# Patient Record
Sex: Female | Born: 1985 | Race: White | Hispanic: No | Marital: Married | Smoking: Former smoker
Health system: Southern US, Community
[De-identification: ages and names within clinical notes are randomized; demographics above are authoritative.]

## PROBLEM LIST (undated history)

## (undated) DIAGNOSIS — R51 Headache: Secondary | ICD-10-CM

## (undated) DIAGNOSIS — Z800 Family history of malignant neoplasm of digestive organs: Secondary | ICD-10-CM

## (undated) DIAGNOSIS — B009 Herpesviral infection, unspecified: Secondary | ICD-10-CM

## (undated) DIAGNOSIS — Z803 Family history of malignant neoplasm of breast: Secondary | ICD-10-CM

## (undated) DIAGNOSIS — Z8049 Family history of malignant neoplasm of other genital organs: Secondary | ICD-10-CM

## (undated) DIAGNOSIS — Z808 Family history of malignant neoplasm of other organs or systems: Secondary | ICD-10-CM

## (undated) HISTORY — DX: Family history of malignant neoplasm of other organs or systems: Z80.8

## (undated) HISTORY — PX: NO PAST SURGERIES: SHX2092

## (undated) HISTORY — DX: Herpesviral infection, unspecified: B00.9

## (undated) HISTORY — DX: Family history of malignant neoplasm of breast: Z80.3

## (undated) HISTORY — DX: Family history of malignant neoplasm of digestive organs: Z80.0

## (undated) HISTORY — DX: Family history of malignant neoplasm of other genital organs: Z80.49

## (undated) HISTORY — DX: Headache: R51

---
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
## 2007-10-22 ENCOUNTER — Emergency Department (HOSPITAL_BASED_OUTPATIENT_CLINIC_OR_DEPARTMENT_OTHER): Admission: EM | Admit: 2007-10-22 | Discharge: 2007-10-23 | Payer: Self-pay | Admitting: Emergency Medicine

## 2008-08-09 ENCOUNTER — Encounter: Payer: Self-pay | Admitting: Obstetrics and Gynecology

## 2008-08-09 ENCOUNTER — Ambulatory Visit: Payer: Self-pay | Admitting: Obstetrics & Gynecology

## 2008-08-09 LAB — CONVERTED CEMR LAB
Antibody Screen: NEGATIVE
Basophils Absolute: 0 10*3/uL (ref 0.0–0.1)
Basophils Relative: 0 % (ref 0–1)
Eosinophils Absolute: 0.2 10*3/uL (ref 0.0–0.7)
Eosinophils Relative: 2 % (ref 0–5)
HCT: 43.1 % (ref 36.0–46.0)
Hemoglobin: 14.6 g/dL (ref 12.0–15.0)
Hepatitis B Surface Ag: NEGATIVE
Lymphocytes Relative: 23 % (ref 12–46)
Lymphs Abs: 3.1 10*3/uL (ref 0.7–4.0)
MCHC: 33.9 g/dL (ref 30.0–36.0)
MCV: 91.5 fL (ref 78.0–100.0)
Monocytes Absolute: 0.9 10*3/uL (ref 0.1–1.0)
Monocytes Relative: 7 % (ref 3–12)
Neutro Abs: 9.1 10*3/uL — ABNORMAL HIGH (ref 1.7–7.7)
Neutrophils Relative %: 68 % (ref 43–77)
Platelets: 360 10*3/uL (ref 150–400)
RBC: 4.71 M/uL (ref 3.87–5.11)
RDW: 13.8 % (ref 11.5–15.5)
Rh Type: POSITIVE
Rubella: 32.1 intl units/mL — ABNORMAL HIGH
WBC: 13.4 10*3/uL — ABNORMAL HIGH (ref 4.0–10.5)
hCG, Beta Chain, Quant, S: 33813.4 milliintl units/mL

## 2008-08-10 ENCOUNTER — Inpatient Hospital Stay (HOSPITAL_COMMUNITY): Admission: RE | Admit: 2008-08-10 | Discharge: 2008-08-10 | Payer: Self-pay | Admitting: Obstetrics and Gynecology

## 2009-08-27 ENCOUNTER — Emergency Department (HOSPITAL_COMMUNITY): Admission: EM | Admit: 2009-08-27 | Discharge: 2009-08-27 | Payer: Self-pay | Admitting: Emergency Medicine

## 2010-05-11 LAB — RAPID STREP SCREEN (MED CTR MEBANE ONLY): Streptococcus, Group A Screen (Direct): POSITIVE — AB

## 2011-02-24 NOTE — L&D Delivery Note (Signed)
Delivery Note At 3:34 AM a viable female was delivered via Vaginal, Vacuum assisted Delivery (Presentation: Vertex, OA).  APGAR: 4, 7; weight .   Placenta status: Intact, Spontaneous.  Cord: 3 vessels with the following complications: None.  Cord pH: pending.  Anesthesia:  Epidural Episiotomy: None Lacerations: 4th degree;Perineal Suture Repair: 3.0 vicryl Est. Blood Loss (mL):   Mom to postpartum.  Baby to nursery-stable.  [name] 12/11/2011, 4:31 AM

## 2011-02-24 NOTE — L&D Delivery Note (Signed)
Pt. Was complete and pushing x 2.5 hours with head at + 3-+4 station and slow progress. EFW 8-8.5 lbs. LOA position.  Risks and benefits of Vacuum discussed and pt. Agreed after 10 minutes of FHR in the 90's.  Bladder emptied. Kiwi vacuum applied and pulled x 6 contractions with steady progress and no pop-offs.  After delivery of the head, nuchal cord reduced x 1, there was a moderate shoulder dystocia, relieved with delivery of the posterior shoulder after Mc-Roberts.  Infant taken to warmer. Pt. Delivered a 9 lb 15 oz VMI.  Peds arrived to help resuscitate the baby and noted a left clavicular fracture. Cord pH 7.235, cord blood obtained. Placenta delivered spontaneously and intact with 3VC.  4th degree laceration noted.  4th degree repaired with 3-0 vicryl on an SH, running in 2 layers.  Normal rectal exam following closure.  3rd degree repair with 2-0 Vicryl in 2 figure of eights.  2nd degree repaired with 2-0 vicryl running.  Bleeding noted and vagina packed with 1 inch packing in Estrace cream.  Moderate vulvar edema noted.  Ice pack applied.

## 2011-04-06 ENCOUNTER — Ambulatory Visit (INDEPENDENT_AMBULATORY_CARE_PROVIDER_SITE_OTHER): Payer: Private Health Insurance - Indemnity | Admitting: Family Medicine

## 2011-04-06 VITALS — BP 119/72 | Ht 62.0 in | Wt 142.0 lb

## 2011-04-06 DIAGNOSIS — O3680X0 Pregnancy with inconclusive fetal viability, not applicable or unspecified: Secondary | ICD-10-CM

## 2011-04-06 DIAGNOSIS — Z3480 Encounter for supervision of other normal pregnancy, unspecified trimester: Secondary | ICD-10-CM

## 2011-04-06 MED ORDER — PRENATAL RX 60-1 MG PO TABS: 1.0000 | ORAL_TABLET | Freq: Every day | ORAL | Status: DC

## 2011-04-06 NOTE — Progress Notes (Signed)
Patient is here for her new OB intake she is very nervous as she miscarried with her first pregnancy at approximately 11 weeks.  Discussed first trimester screening, her and her partner will consider and let us know at her next visit.  Blood work is drawn and bedside ultrasound shows intrauterine gestational sac measuring approximately 5 weeks 5 days.  No fetal pole is visualized, we will send patient for an official ultrasound at the hospital to confirm her dates and viability in about two weeks.  Prenatal vitamins were called in for her.

## 2011-04-07 LAB — OBSTETRIC PANEL
Antibody Screen: NEGATIVE
Basophils Absolute: 0 10*3/uL (ref 0.0–0.1)
Basophils Relative: 0 % (ref 0–1)
Eosinophils Absolute: 0.3 10*3/uL (ref 0.0–0.7)
Eosinophils Relative: 3 % (ref 0–5)
HCT: 42.1 % (ref 36.0–46.0)
Hemoglobin: 13.6 g/dL (ref 12.0–15.0)
Hepatitis B Surface Ag: NEGATIVE
Lymphocytes Relative: 20 % (ref 12–46)
Lymphs Abs: 2.4 10*3/uL (ref 0.7–4.0)
MCH: 30.9 pg (ref 26.0–34.0)
MCHC: 32.3 g/dL (ref 30.0–36.0)
MCV: 95.7 fL (ref 78.0–100.0)
Monocytes Absolute: 0.7 10*3/uL (ref 0.1–1.0)
Monocytes Relative: 6 % (ref 3–12)
Neutro Abs: 8.3 10*3/uL — ABNORMAL HIGH (ref 1.7–7.7)
Neutrophils Relative %: 71 % (ref 43–77)
Platelets: 330 10*3/uL (ref 150–400)
RBC: 4.4 MIL/uL (ref 3.87–5.11)
RDW: 13.4 % (ref 11.5–15.5)
Rh Type: POSITIVE
Rubella: 38.5 IU/mL — ABNORMAL HIGH
WBC: 11.7 10*3/uL — ABNORMAL HIGH (ref 4.0–10.5)

## 2011-04-07 LAB — HIV ANTIBODY (ROUTINE TESTING W REFLEX): HIV: NONREACTIVE

## 2011-04-20 ENCOUNTER — Ambulatory Visit (HOSPITAL_COMMUNITY)
Admission: RE | Admit: 2011-04-20 | Discharge: 2011-04-20 | Disposition: A | Discharge status: Home / Self Care | Payer: Private Health Insurance - Indemnity | Source: Ambulatory Visit | Attending: Obstetrics & Gynecology | Admitting: Obstetrics & Gynecology

## 2011-04-20 DIAGNOSIS — O3680X0 Pregnancy with inconclusive fetal viability, not applicable or unspecified: Secondary | ICD-10-CM

## 2011-04-20 DIAGNOSIS — O09299 Supervision of pregnancy with other poor reproductive or obstetric history, unspecified trimester: Secondary | ICD-10-CM | POA: Insufficient documentation

## 2011-04-20 DIAGNOSIS — Z3689 Encounter for other specified antenatal screening: Secondary | ICD-10-CM | POA: Insufficient documentation

## 2011-04-23 ENCOUNTER — Ambulatory Visit (INDEPENDENT_AMBULATORY_CARE_PROVIDER_SITE_OTHER): Payer: Private Health Insurance - Indemnity | Admitting: Obstetrics & Gynecology

## 2011-04-23 ENCOUNTER — Encounter: Payer: Self-pay | Admitting: Obstetrics & Gynecology

## 2011-04-23 ENCOUNTER — Other Ambulatory Visit: Payer: Self-pay | Admitting: Obstetrics & Gynecology

## 2011-04-23 VITALS — BP 104/68 | Wt 143.0 lb

## 2011-04-23 DIAGNOSIS — Z1272 Encounter for screening for malignant neoplasm of vagina: Secondary | ICD-10-CM

## 2011-04-23 DIAGNOSIS — Z3480 Encounter for supervision of other normal pregnancy, unspecified trimester: Secondary | ICD-10-CM

## 2011-04-23 DIAGNOSIS — Z113 Encounter for screening for infections with a predominantly sexual mode of transmission: Secondary | ICD-10-CM

## 2011-04-23 DIAGNOSIS — Z3682 Encounter for antenatal screening for nuchal translucency: Secondary | ICD-10-CM

## 2011-04-23 DIAGNOSIS — L700 Acne vulgaris: Secondary | ICD-10-CM | POA: Insufficient documentation

## 2011-04-23 DIAGNOSIS — Z3490 Encounter for supervision of normal pregnancy, unspecified, unspecified trimester: Secondary | ICD-10-CM

## 2011-04-23 DIAGNOSIS — L708 Other acne: Secondary | ICD-10-CM

## 2011-04-23 NOTE — Progress Notes (Signed)
   Subjective:    Norma Fuentes is a G2P0010 [redacted]w[redacted]d being seen today for her first obstetrical visit.  Her obstetrical history is significant for prior early miscarriage. Patient does intend to breast feed. Pregnancy history fully reviewed.  Patient reports fatigue, nausea and no bleeding.  Filed Vitals:   04/23/11 0900  BP: 104/68  Weight: 143 lb (64.864 kg)    HISTORY: OB History    Grav Para Term Preterm Abortions TAB SAB Ect Mult Living   2 0 0 0 1 0 1 0 0 0      # Outc Date GA Lbr Len/2nd Wgt Sex Del Anes PTL Lv   1 SAB 2011           2 CUR              Past Medical History  Diagnosis Date  . Headache    No past surgical history on file. Family History  Problem Relation Age of Onset  . Endometriosis Maternal Aunt   . Diabetes Paternal Grandmother      Exam    Uterine Size: size equals dates  Pelvic Exam:    Perineum: No Hemorrhoids, Normal Perineum   Vulva: normal   Vagina:  normal mucosa, normal discharge   pH: not done   Cervix: no cervical motion tenderness, no lesions and nulliparous appearance Pap done   Adnexa: normal adnexa and no mass, fullness, tenderness   Bony Pelvis: average  System: Breast:  normal appearance, no masses or tenderness, No nipple retraction or dimpling   Skin: dermatitis noted: patient has extensive cystic acne on her face, torso, back and extremities. Has been seen by dermatologists, used multiple agents   Neurologic: oriented, normal, normal mood   Extremities: normal strength, tone, and muscle mass   HEENT PERRLA and extra ocular movement intact   Mouth/Teeth mucous membranes moist, pharynx normal without lesions and dental hygiene good   Neck supple and no masses   Cardiovascular: regular rate and rhythm   Respiratory:  appears well, vitals normal, no respiratory distress, acyanotic, normal RR, neck free of mass or lymphadenopathy, chest clear, no wheezing, crepitations, rhonchi, normal symmetric air entry   Abdomen:  soft, non-tender; bowel sounds normal; no masses,  no organomegaly   Urinary: urethral meatus normal      Assessment:    Pregnancy: G2P0010 Patient Active Problem List  Diagnoses  . Supervision of normal pregnancy     Plan:     Continue prenatal vitamins. Discussed avoidance of acne-treatment agents such as retinoid, accutane that can be teratogenic; patient has not used these agents for several months Problem list reviewed and updated. Genetic Screening discussed First Screen: ordered. Ultrasound discussed; fetal survey: ordered. Bleeding and pain precautions reviewed.  Follow up in 4 weeks.  Jaynie Collins A M.D. 04/23/2011

## 2011-04-23 NOTE — Patient Instructions (Signed)
Pregnancy - First Trimester During sexual intercourse, millions of sperm go into the vagina. Only 1 sperm will penetrate and fertilize the female egg while it is in the Fallopian tube. One week later, the fertilized egg implants into the wall of the uterus. An embryo begins to develop into a baby. At 6 to 8 weeks, the eyes and face are formed and the heartbeat can be seen on ultrasound. At the end of 12 weeks (first trimester), all the baby's organs are formed. Now that you are pregnant, you will want to do everything you can to have a healthy baby. Two of the most important things are to get good prenatal care and follow your caregiver's instructions. Prenatal care is all the medical care you receive before the baby's birth. It is given to prevent, find, and treat problems during the pregnancy and childbirth. PRENATAL EXAMS  During prenatal visits, your weight, blood pressure and urine are checked. This is done to make sure you are healthy and progressing normally during the pregnancy.   A pregnant woman should gain 25 to 35 pounds during the pregnancy. However, if you are over weight or underweight, your caregiver will advise you regarding your weight.   Your caregiver will ask and answer questions for you.   Blood work, cervical cultures, other necessary tests and a Pap test are done during your prenatal exams. These tests are done to check on your health and the probable health of your baby. Tests are strongly recommended and done for HIV with your permission. This is the virus that causes AIDS. These tests are done because medications can be given to help prevent your baby from being born with this infection should you have been infected without knowing it. Blood work is also used to find out your blood type, previous infections and follow your blood levels (hemoglobin).   Low hemoglobin (anemia) is common during pregnancy. Iron and vitamins are given to help prevent this. Later in the pregnancy,  blood tests for diabetes will be done along with any other tests if any problems develop. You may need tests to make sure you and the baby are doing well.   You may need other tests to make sure you and the baby are doing well.  CHANGES DURING THE FIRST TRIMESTER (THE FIRST 3 MONTHS OF PREGNANCY) Your body goes through many changes during pregnancy. They vary from person to person. Talk to your caregiver about changes you notice and are concerned about. Changes can include:  Your menstrual period stops.   The egg and sperm carry the genes that determine what you look like. Genes from you and your partner are forming a baby. The female genes determine whether the baby is a boy or a girl.   Your body increases in girth and you may feel bloated.   Feeling sick to your stomach (nauseous) and throwing up (vomiting). If the vomiting is uncontrollable, call your caregiver.   Your breasts will begin to enlarge and become tender.   Your nipples may stick out more and become darker.   The need to urinate more. Painful urination may mean you have a bladder infection.   Tiring easily.   Loss of appetite.   Cravings for certain kinds of food.   At first, you may gain or lose a couple of pounds.   You may have changes in your emotions from day to day (excited to be pregnant or concerned something may go wrong with the pregnancy and baby).     You may have more vivid and strange dreams.  HOME CARE INSTRUCTIONS   It is very important to avoid all smoking, alcohol and un-prescribed drugs during your pregnancy. These affect the formation and growth of the baby. Avoid chemicals while pregnant to ensure the delivery of a healthy infant.   Start your prenatal visits by the 12th week of pregnancy. They are usually scheduled monthly at first, then more often in the last 2 months before delivery. Keep your caregiver's appointments. Follow your caregiver's instructions regarding medication use, blood and lab  tests, exercise, and diet.   During pregnancy, you are providing food for you and your baby. Eat regular, well-balanced meals. Choose foods such as meat, fish, milk and other low fat dairy products, vegetables, fruits, and whole-grain breads and cereals. Your caregiver will tell you of the ideal weight gain.   You can help morning sickness by keeping soda crackers at the bedside. Eat a couple before arising in the morning. You may want to use the crackers without salt on them.   Eating 4 to 5 small meals rather than 3 large meals a day also may help the nausea and vomiting.   Drinking liquids between meals instead of during meals also seems to help nausea and vomiting.   A physical sexual relationship may be continued throughout pregnancy if there are no other problems. Problems may be early (premature) leaking of amniotic fluid from the membranes, vaginal bleeding, or belly (abdominal) pain.   Exercise regularly if there are no restrictions. Check with your caregiver or physical therapist if you are unsure of the safety of some of your exercises. Greater weight gain will occur in the last 2 trimesters of pregnancy. Exercising will help:   Control your weight.   Keep you in shape.   Prepare you for labor and delivery.   Help you lose your pregnancy weight after you deliver your baby.   Wear a good support or jogging bra for breast tenderness during pregnancy. This may help if worn during sleep too.   Ask when prenatal classes are available. Begin classes when they are offered.   Do not use hot tubs, steam rooms or saunas.   Wear your seat belt when driving. This protects you and your baby if you are in an accident.   Avoid raw meat, uncooked cheese, cat litter boxes and soil used by cats throughout the pregnancy. These carry germs that can cause birth defects in the baby.   The first trimester is a good time to visit your dentist for your dental health. Getting your teeth cleaned is  OK. Use a softer toothbrush and brush gently during pregnancy.   Ask for help if you have financial, counseling or nutritional needs during pregnancy. Your caregiver will be able to offer counseling for these needs as well as refer you for other special needs.   Do not take any medications or herbs unless told by your caregiver.   Inform your caregiver if there is any mental or physical domestic violence.   Make a list of emergency phone numbers of family, friends, hospital, and police and fire departments.   Write down your questions. Take them to your prenatal visit.   Do not douche.   Do not cross your legs.   If you have to stand for long periods of time, rotate you feet or take small steps in a circle.   You may have more vaginal secretions that may require a sanitary pad. Do not use   tampons or scented sanitary pads.  MEDICATIONS AND DRUG USE IN PREGNANCY  Take prenatal vitamins as directed. The vitamin should contain 1 milligram of folic acid. Keep all vitamins out of reach of children. Only a couple vitamins or tablets containing iron may be fatal to a baby or young child when ingested.   Avoid use of all medications, including herbs, over-the-counter medications, not prescribed or suggested by your caregiver. Only take over-the-counter or prescription medicines for pain, discomfort, or fever as directed by your caregiver. Do not use aspirin, ibuprofen, or naproxen unless directed by your caregiver.   Let your caregiver also know about herbs you may be using.   Alcohol is related to a number of birth defects. This includes fetal alcohol syndrome. All alcohol, in any form, should be avoided completely. Smoking will cause low birth rate and premature babies.   Street or illegal drugs are very harmful to the baby. They are absolutely forbidden. A baby born to an addicted mother will be addicted at birth. The baby will go through the same withdrawal an adult does.   Let your  caregiver know about any medications that you have to take and for what reason you take them.  MISCARRIAGE IS COMMON DURING PREGNANCY A miscarriage does not mean you did something wrong. It is not a reason to worry about getting pregnant again. Your caregiver will help you with questions you may have. If you have a miscarriage, you may need minor surgery. SEEK MEDICAL CARE IF:  You have any concerns or worries during your pregnancy. It is better to call with your questions if you feel they cannot wait, rather than worry about them. SEEK IMMEDIATE MEDICAL CARE IF:   An unexplained oral temperature above 102 F (38.9 C) develops, or as your caregiver suggests.   You have leaking of fluid from the vagina (birth canal). If leaking membranes are suspected, take your temperature and inform your caregiver of this when you call.   There is vaginal spotting or bleeding. Notify your caregiver of the amount and how many pads are used.   You develop a bad smelling vaginal discharge with a change in the color.   You continue to feel sick to your stomach (nauseated) and have no relief from remedies suggested. You vomit blood or coffee ground-like materials.   You lose more than 2 pounds of weight in 1 week.   You gain more than 2 pounds of weight in 1 week and you notice swelling of your face, hands, feet, or legs.   You gain 5 pounds or more in 1 week (even if you do not have swelling of your hands, face, legs, or feet).   You get exposed to German measles and have never had them.   You are exposed to fifth disease or chickenpox.   You develop belly (abdominal) pain. Round ligament discomfort is a common non-cancerous (benign) cause of abdominal pain in pregnancy. Your caregiver still must evaluate this.   You develop headache, fever, diarrhea, pain with urination, or shortness of breath.   You fall or are in a car accident or have any kind of trauma.   There is mental or physical violence in  your home.  Document Released: 02/03/2001 Document Revised: 10/22/2010 Document Reviewed: 08/07/2008 [employer] Patient Information 2012 [employer]. 

## 2011-05-21 ENCOUNTER — Ambulatory Visit (INDEPENDENT_AMBULATORY_CARE_PROVIDER_SITE_OTHER): Payer: Private Health Insurance - Indemnity | Admitting: Obstetrics & Gynecology

## 2011-05-21 VITALS — BP 113/70 | Wt 148.0 lb

## 2011-05-21 DIAGNOSIS — Z3400 Encounter for supervision of normal first pregnancy, unspecified trimester: Secondary | ICD-10-CM

## 2011-05-21 DIAGNOSIS — Z3490 Encounter for supervision of normal pregnancy, unspecified, unspecified trimester: Secondary | ICD-10-CM

## 2011-05-21 DIAGNOSIS — Z3480 Encounter for supervision of other normal pregnancy, unspecified trimester: Secondary | ICD-10-CM

## 2011-05-21 NOTE — Progress Notes (Signed)
Routine visit. No problems. She has First Trimester screen scheduled.

## 2011-06-02 ENCOUNTER — Encounter: Payer: Self-pay | Admitting: Obstetrics & Gynecology

## 2011-06-02 ENCOUNTER — Ambulatory Visit (HOSPITAL_COMMUNITY)
Admission: RE | Admit: 2011-06-02 | Discharge: 2011-06-02 | Disposition: A | Discharge status: Home / Self Care | Payer: Private Health Insurance - Indemnity | Source: Ambulatory Visit | Attending: Obstetrics & Gynecology | Admitting: Obstetrics & Gynecology

## 2011-06-02 DIAGNOSIS — Z3689 Encounter for other specified antenatal screening: Secondary | ICD-10-CM | POA: Insufficient documentation

## 2011-06-02 DIAGNOSIS — O09299 Supervision of pregnancy with other poor reproductive or obstetric history, unspecified trimester: Secondary | ICD-10-CM | POA: Insufficient documentation

## 2011-06-02 DIAGNOSIS — Z3682 Encounter for antenatal screening for nuchal translucency: Secondary | ICD-10-CM

## 2011-06-02 DIAGNOSIS — O351XX0 Maternal care for (suspected) chromosomal abnormality in fetus, not applicable or unspecified: Secondary | ICD-10-CM | POA: Insufficient documentation

## 2011-06-02 DIAGNOSIS — O3510X0 Maternal care for (suspected) chromosomal abnormality in fetus, unspecified, not applicable or unspecified: Secondary | ICD-10-CM | POA: Insufficient documentation

## 2011-06-17 ENCOUNTER — Ambulatory Visit (INDEPENDENT_AMBULATORY_CARE_PROVIDER_SITE_OTHER): Payer: Private Health Insurance - Indemnity | Admitting: Obstetrics and Gynecology

## 2011-06-17 VITALS — BP 84/60 | Wt 152.0 lb

## 2011-06-17 DIAGNOSIS — Z3480 Encounter for supervision of other normal pregnancy, unspecified trimester: Secondary | ICD-10-CM

## 2011-06-17 DIAGNOSIS — L708 Other acne: Secondary | ICD-10-CM

## 2011-06-17 DIAGNOSIS — L700 Acne vulgaris: Secondary | ICD-10-CM

## 2011-06-17 DIAGNOSIS — Z3490 Encounter for supervision of normal pregnancy, unspecified, unspecified trimester: Secondary | ICD-10-CM

## 2011-06-17 NOTE — Progress Notes (Signed)
Patient doing well without any complaints. She declined Second trimester screening. Will schedule anatomy ultrasound

## 2011-07-06 ENCOUNTER — Ambulatory Visit (HOSPITAL_COMMUNITY)
Admission: RE | Admit: 2011-07-06 | Discharge: 2011-07-06 | Disposition: A | Discharge status: Home / Self Care | Payer: Private Health Insurance - Indemnity | Source: Ambulatory Visit | Attending: Obstetrics and Gynecology | Admitting: Obstetrics and Gynecology

## 2011-07-06 DIAGNOSIS — Z1389 Encounter for screening for other disorder: Secondary | ICD-10-CM | POA: Insufficient documentation

## 2011-07-06 DIAGNOSIS — Z3490 Encounter for supervision of normal pregnancy, unspecified, unspecified trimester: Secondary | ICD-10-CM

## 2011-07-06 DIAGNOSIS — O358XX0 Maternal care for other (suspected) fetal abnormality and damage, not applicable or unspecified: Secondary | ICD-10-CM | POA: Insufficient documentation

## 2011-07-06 DIAGNOSIS — Z363 Encounter for antenatal screening for malformations: Secondary | ICD-10-CM | POA: Insufficient documentation

## 2011-07-15 ENCOUNTER — Ambulatory Visit (INDEPENDENT_AMBULATORY_CARE_PROVIDER_SITE_OTHER): Payer: Private Health Insurance - Indemnity | Admitting: Obstetrics & Gynecology

## 2011-07-15 VITALS — BP 96/60 | Wt 162.0 lb

## 2011-07-15 DIAGNOSIS — Z3490 Encounter for supervision of normal pregnancy, unspecified, unspecified trimester: Secondary | ICD-10-CM

## 2011-07-15 DIAGNOSIS — Z3480 Encounter for supervision of other normal pregnancy, unspecified trimester: Secondary | ICD-10-CM

## 2011-07-15 DIAGNOSIS — L700 Acne vulgaris: Secondary | ICD-10-CM

## 2011-07-15 DIAGNOSIS — L708 Other acne: Secondary | ICD-10-CM

## 2011-07-15 NOTE — Progress Notes (Signed)
Normal anatomy scan.  No other complaints or concerns.  Fetal movement and labor precautions reviewed.

## 2011-07-15 NOTE — Patient Instructions (Signed)
Breastfeeding BENEFITS OF BREASTFEEDING For the baby  The first milk (colostrum) helps the baby's digestive system function better.   There are antibodies from the mother in the milk that help the baby fight off infections.   The baby has a lower incidence of asthma, allergies, and SIDS (sudden infant death syndrome).   The nutrients in breast milk are better than formulas for the baby and helps the baby's brain grow better.   Babies who breastfeed have less gas, colic, and constipation.  For the mother  Breastfeeding helps develop a very special bond between mother and baby.   It is more convenient, always available at the correct temperature and cheaper than formula feeding.   It burns calories in the mother and helps with losing weight that was gained during pregnancy.   It makes the uterus contract back down to normal size faster and slows bleeding following delivery.   Breastfeeding mothers have a lower risk of developing breast cancer.  NURSE FREQUENTLY  A healthy, full-term baby may breastfeed as often as every hour or space his or her feedings to every 3 hours.   How often to nurse will vary from baby to baby. Watch your baby for signs of hunger, not the clock.   Nurse as often as the baby requests, or when you feel the need to reduce the fullness of your breasts.   Awaken the baby if it has been 3 to 4 hours since the last feeding.   Frequent feeding will help the mother make more milk and will prevent problems like sore nipples and engorgement of the breasts.  BABY'S POSITION AT THE BREAST  Whether lying down or sitting, be sure that the baby's tummy is facing your tummy.   Support the breast with 4 fingers underneath the breast and the thumb above. Make sure your fingers are well away from the nipple and baby's mouth.   Stroke the baby's lips and cheek closest to the breast gently with your finger or nipple.   When the baby's mouth is open wide enough, place all  of your nipple and as much of the dark area around the nipple as possible into your baby's mouth.   Pull the baby in close so the tip of the nose and the baby's cheeks touch the breast during the feeding.  FEEDINGS  The length of each feeding varies from baby to baby and from feeding to feeding.   The baby must suck about 2 to 3 minutes for your milk to get to him or her. This is called a "let down." For this reason, allow the baby to feed on each breast as long as he or she wants. Your baby will end the feeding when he or she has received the right balance of nutrients.   To break the suction, put your finger into the corner of the baby's mouth and slide it between his or her gums before removing your breast from his or her mouth. This will help prevent sore nipples.  REDUCING BREAST ENGORGEMENT  In the first week after your baby is born, you may experience signs of breast engorgement. When breasts are engorged, they feel heavy, warm, full, and may be tender to the touch. You can reduce engorgement if you:   Nurse frequently, every 2 to 3 hours. Mothers who breastfeed early and often have fewer problems with engorgement.   Place light ice packs on your breasts between feedings. This reduces swelling. Wrap the ice packs in a   lightweight towel to protect your skin.   Apply moist hot packs to your breast for 5 to 10 minutes before each feeding. This increases circulation and helps the milk flow.   Gently massage your breast before and during the feeding.   Make sure that the baby empties at least one breast at every feeding before switching sides.   Use a breast pump to empty the breasts if your baby is sleepy or not nursing well. You may also want to pump if you are returning to work or or you feel you are getting engorged.   Avoid bottle feeds, pacifiers or supplemental feedings of water or juice in place of breastfeeding.   Be sure the baby is latched on and positioned properly while  breastfeeding.   Prevent fatigue, stress, and anemia.   Wear a supportive bra, avoiding underwire styles.   Eat a balanced diet with enough fluids.  If you follow these suggestions, your engorgement should improve in 24 to 48 hours. If you are still experiencing difficulty, call your lactation consultant or caregiver. IS MY BABY GETTING ENOUGH MILK? Sometimes, mothers worry about whether their babies are getting enough milk. You can be assured that your baby is getting enough milk if:  The baby is actively sucking and you hear swallowing.   The baby nurses at least 8 to 12 times in a 24 hour time period. Nurse your baby until he or she unlatches or falls asleep at the first breast (at least 10 to 20 minutes), then offer the second side.   The baby is wetting 5 to 6 disposable diapers (6 to 8 cloth diapers) in a 24 hour period by 5 to 6 days of age.   The baby is having at least 2 to 3 stools every 24 hours for the first few months. Breast milk is all the food your baby needs. It is not necessary for your baby to have water or formula. In fact, to help your breasts make more milk, it is best not to give your baby supplemental feedings during the early weeks.   The stool should be soft and yellow.   The baby should gain 4 to 7 ounces per week after he is 4 days old.  TAKE CARE OF YOURSELF Take care of your breasts by:  Bathing or showering daily.   Avoiding the use of soaps on your nipples.   Start feedings on your left breast at one feeding and on your right breast at the next feeding.   You will notice an increase in your milk supply 2 to 5 days after delivery. You may feel some discomfort from engorgement, which makes your breasts very firm and often tender. Engorgement "peaks" out within 24 to 48 hours. In the meantime, apply warm moist towels to your breasts for 5 to 10 minutes before feeding. Gentle massage and expression of some milk before feeding will soften your breasts, making  it easier for your baby to latch on. Wear a well fitting nursing bra and air dry your nipples for 10 to 15 minutes after each feeding.   Only use cotton bra pads.   Only use pure lanolin on your nipples after nursing. You do not need to wash it off before nursing.  Take care of yourself by:   Eating well-balanced meals and nutritious snacks.   Drinking milk, fruit juice, and water to satisfy your thirst (about 8 glasses a day).   Getting plenty of rest.   Increasing calcium in   your diet (1200 mg a day).   Avoiding foods that you notice affect the baby in a bad way.  SEEK MEDICAL CARE IF:   You have any questions or difficulty with breastfeeding.   You need help.   You have a hard, red, sore area on your breast, accompanied by a fever of 100.5 F (38.1 C) or more.   Your baby is too sleepy to eat well or is having trouble sleeping.   Your baby is wetting less than 6 diapers per day, by 5 days of age.   Your baby's skin or white part of his or her eyes is more yellow than it was in the hospital.   You feel depressed.  Document Released: 02/09/2005 Document Revised: 01/29/2011 Document Reviewed: 09/24/2008 [employer] Patient Information 2012 [employer]. 

## 2011-08-12 ENCOUNTER — Ambulatory Visit (INDEPENDENT_AMBULATORY_CARE_PROVIDER_SITE_OTHER): Payer: Private Health Insurance - Indemnity | Admitting: Obstetrics & Gynecology

## 2011-08-12 VITALS — BP 98/67 | Wt 169.0 lb

## 2011-08-12 DIAGNOSIS — Z3480 Encounter for supervision of other normal pregnancy, unspecified trimester: Secondary | ICD-10-CM

## 2011-08-12 DIAGNOSIS — L708 Other acne: Secondary | ICD-10-CM

## 2011-08-12 DIAGNOSIS — L700 Acne vulgaris: Secondary | ICD-10-CM

## 2011-08-12 DIAGNOSIS — Z3490 Encounter for supervision of normal pregnancy, unspecified, unspecified trimester: Secondary | ICD-10-CM

## 2011-08-12 NOTE — Progress Notes (Signed)
No complaints or concerns.  Fetal movement and labor precautions reviewed. Third trimester labs next visit.

## 2011-08-12 NOTE — Patient Instructions (Signed)
Return to clinic for any obstetric concerns or go to [facility] for evaluation  

## 2011-09-03 ENCOUNTER — Ambulatory Visit (INDEPENDENT_AMBULATORY_CARE_PROVIDER_SITE_OTHER): Payer: Private Health Insurance - Indemnity | Admitting: Obstetrics & Gynecology

## 2011-09-03 VITALS — BP 103/49 | Wt 173.0 lb

## 2011-09-03 DIAGNOSIS — Z23 Encounter for immunization: Secondary | ICD-10-CM

## 2011-09-03 DIAGNOSIS — Z3480 Encounter for supervision of other normal pregnancy, unspecified trimester: Secondary | ICD-10-CM

## 2011-09-03 DIAGNOSIS — Z3490 Encounter for supervision of normal pregnancy, unspecified, unspecified trimester: Secondary | ICD-10-CM

## 2011-09-03 LAB — CBC
HCT: 34.2 % — ABNORMAL LOW (ref 36.0–46.0)
Hemoglobin: 11.4 g/dL — ABNORMAL LOW (ref 12.0–15.0)
MCH: 30.3 pg (ref 26.0–34.0)
MCHC: 33.3 g/dL (ref 30.0–36.0)
MCV: 91 fL (ref 78.0–100.0)
Platelets: 319 10*3/uL (ref 150–400)
RBC: 3.76 MIL/uL — ABNORMAL LOW (ref 3.87–5.11)
RDW: 14 % (ref 11.5–15.5)
WBC: 14.2 10*3/uL — ABNORMAL HIGH (ref 4.0–10.5)

## 2011-09-03 MED ORDER — TETANUS-DIPHTH-ACELL PERTUSSIS 5-2.5-18.5 LF-MCG/0.5 IM SUSP
0.5000 mL | Freq: Once | INTRAMUSCULAR | Status: AC
  Administered 2011-09-03: 0.5 mL via INTRAMUSCULAR

## 2011-09-03 NOTE — Progress Notes (Signed)
1 hr GTT, third trimester labs today. Will also receive TDap vaccine today. No other complaints or concerns.  Fetal movement and labor precautions reviewed.

## 2011-09-03 NOTE — Patient Instructions (Addendum)
Return to clinic for any obstetric concerns or go to [facility] for evaluation  Pregnancy - Third Trimester The third trimester of pregnancy (the last 3 months) is a period of the most rapid growth for you and your baby. The baby approaches a length of 20 inches and a weight of 6 to 10 pounds. The baby is adding on fat and getting ready for life outside your body. While inside, babies have periods of sleeping and waking, suck their thumbs, and hiccups. You can often feel small contractions of the uterus. This is false labor. It is also called Braxton-Hicks contractions. This is like a practice for labor. The usual problems in this stage of pregnancy include more difficulty breathing, swelling of the hands and feet from water retention, and having to urinate more often because of the uterus and baby pressing on your bladder.  PRENATAL EXAMS  Blood work may continue to be done during prenatal exams. These tests are done to check on your health and the probable health of your baby. Blood work is used to follow your blood levels (hemoglobin). Anemia (low hemoglobin) is common during pregnancy. Iron and vitamins are given to help prevent this. You may also continue to be checked for diabetes. Some of the past blood tests may be done again.   The size of the uterus is measured during each visit. This makes sure your baby is growing properly according to your pregnancy dates.   Your blood pressure is checked every prenatal visit. This is to make sure you are not getting toxemia.   Your urine is checked every prenatal visit for infection, diabetes and protein.   Your weight is checked at each visit. This is done to make sure gains are happening at the suggested rate and that you and your baby are growing normally.   Sometimes, an ultrasound is performed to confirm the position and the proper growth and development of the baby. This is a test done that bounces harmless sound waves off the baby so your caregiver can  more accurately determine due dates.   Discuss the type of pain medication and anesthesia you will have during your labor and delivery.   Discuss the possibility and anesthesia if a Cesarean Section might be necessary.   Inform your caregiver if there is any mental or physical violence at home.  Sometimes, a specialized non-stress test, contraction stress test and biophysical profile are done to make sure the baby is not having a problem. Checking the amniotic fluid surrounding the baby is called an amniocentesis. The amniotic fluid is removed by sticking a needle into the belly (abdomen). This is sometimes done near the end of pregnancy if an early delivery is required. In this case, it is done to help make sure the baby's lungs are mature enough for the baby to live outside of the womb. If the lungs are not mature and it is unsafe to deliver the baby, an injection of cortisone medication is given to the mother 1 to 2 days before the delivery. This helps the baby's lungs mature and makes it safer to deliver the baby. CHANGES OCCURING IN THE THIRD TRIMESTER OF PREGNANCY Your body goes through many changes during pregnancy. They vary from person to person. Talk to your caregiver about changes you notice and are concerned about.  During the last trimester, you have probably had an increase in your appetite. It is normal to have cravings for certain foods. This varies from person to person and pregnancy  to pregnancy.   You may begin to get stretch marks on your hips, abdomen, and breasts. These are normal changes in the body during pregnancy. There are no exercises or medications to take which prevent this change.   Constipation may be treated with a stool softener or adding bulk to your diet. Drinking lots of fluids, fiber in vegetables, fruits, and whole grains are helpful.   Exercising is also helpful. If you have been very active up until your pregnancy, most of these activities can be continued  during your pregnancy. If you have been less active, it is helpful to start an exercise program such as walking. Consult your caregiver before starting exercise programs.   Avoid all smoking, alcohol, un-prescribed drugs, herbs and "street drugs" during your pregnancy. These chemicals affect the formation and growth of the baby. Avoid chemicals throughout the pregnancy to ensure the delivery of a healthy infant.   Backache, varicose veins and hemorrhoids may develop or get worse.   You will tire more easily in the third trimester, which is normal.   The baby's movements may be stronger and more often.   You may become short of breath easily.   Your belly button may stick out.   A yellow discharge may leak from your breasts called colostrum.   You may have a bloody mucus discharge. This usually occurs a few days to a week before labor begins.  HOME CARE INSTRUCTIONS   Keep your caregiver's appointments. Follow your caregiver's instructions regarding medication use, exercise, and diet.   During pregnancy, you are providing food for you and your baby. Continue to eat regular, well-balanced meals. Choose foods such as meat, fish, milk and other low fat dairy products, vegetables, fruits, and whole-grain breads and cereals. Your caregiver will tell you of the ideal weight gain.   A physical sexual relationship may be continued throughout pregnancy if there are no other problems such as early (premature) leaking of amniotic fluid from the membranes, vaginal bleeding, or belly (abdominal) pain.   Exercise regularly if there are no restrictions. Check with your caregiver if you are unsure of the safety of your exercises. Greater weight gain will occur in the last 2 trimesters of pregnancy. Exercising helps:   Control your weight.   Get you in shape for labor and delivery.   You lose weight after you deliver.   Rest a lot with legs elevated, or as needed for leg cramps or low back pain.    Wear a good support or jogging bra for breast tenderness during pregnancy. This may help if worn during sleep. Pads or tissues may be used in the bra if you are leaking colostrum.   Do not use hot tubs, steam rooms, or saunas.   Wear your seat belt when driving. This protects you and your baby if you are in an accident.   Avoid raw meat, cat litter boxes and soil used by cats. These carry germs that can cause birth defects in the baby.   It is easier to loose urine during pregnancy. Tightening up and strengthening the pelvic muscles will help with this problem. You can practice stopping your urination while you are going to the bathroom. These are the same muscles you need to strengthen. It is also the muscles you would use if you were trying to stop from passing gas. You can practice tightening these muscles up 10 times a set and repeating this about 3 times per day. Once you know what muscles  to tighten up, do not perform these exercises during urination. It is more likely to cause an infection by backing up the urine.   Ask for help if you have financial, counseling or nutritional needs during pregnancy. Your caregiver will be able to offer counseling for these needs as well as refer you for other special needs.   Make a list of emergency phone numbers and have them available.   Plan on getting help from family or friends when you go home from the hospital.   Make a trial run to the hospital.   Take prenatal classes with the father to understand, practice and ask questions about the labor and delivery.   Prepare the baby's room/nursery.   Do not travel out of the city unless it is absolutely necessary and with the advice of your caregiver.   Wear only low or no heal shoes to have better balance and prevent falling.  MEDICATIONS AND DRUG USE IN PREGNANCY  Take prenatal vitamins as directed. The vitamin should contain 1 milligram of folic acid. Keep all vitamins out of reach of  children. Only a couple vitamins or tablets containing iron may be fatal to a baby or young child when ingested.   Avoid use of all medications, including herbs, over-the-counter medications, not prescribed or suggested by your caregiver. Only take over-the-counter or prescription medicines for pain, discomfort, or fever as directed by your caregiver. Do not use aspirin, ibuprofen (Motrin, Advil, Nuprin) or naproxen (Aleve) unless OK'd by your caregiver.   Let your caregiver also know about herbs you may be using.   Alcohol is related to a number of birth defects. This includes fetal alcohol syndrome. All alcohol, in any form, should be avoided completely. Smoking will cause low birth rate and premature babies.   Street/illegal drugs are very harmful to the baby. They are absolutely forbidden. A baby born to an addicted mother will be addicted at birth. The baby will go through the same withdrawal an adult does.  SEEK MEDICAL CARE IF: You have any concerns or worries during your pregnancy. It is better to call with your questions if you feel they cannot wait, rather than worry about them. DECISIONS ABOUT CIRCUMCISION You may or may not know the sex of your baby. If you know your baby is a boy, it may be time to think about circumcision. Circumcision is the removal of the foreskin of the penis. This is the skin that covers the sensitive end of the penis. There is no proven medical need for this. Often this decision is made on what is popular at the time or based upon religious beliefs and social issues. You can discuss these issues with your caregiver or pediatrician. SEEK IMMEDIATE MEDICAL CARE IF:   An unexplained oral temperature above 102 F (38.9 C) develops, or as your caregiver suggests.   You have leaking of fluid from the vagina (birth canal). If leaking membranes are suspected, take your temperature and tell your caregiver of this when you call.   There is vaginal spotting, bleeding  or passing clots. Tell your caregiver of the amount and how many pads are used.   You develop a bad smelling vaginal discharge with a change in the color from clear to white.   You develop vomiting that lasts more than 24 hours.   You develop chills or fever.   You develop shortness of breath.   You develop burning on urination.   You loose more than 2 pounds of  weight or gain more than 2 pounds of weight or as suggested by your caregiver.   You notice sudden swelling of your face, hands, and feet or legs.   You develop belly (abdominal) pain. Round ligament discomfort is a common non-cancerous (benign) cause of abdominal pain in pregnancy. Your caregiver still must evaluate you.   You develop a severe headache that does not go away.   You develop visual problems, blurred or double vision.   If you have not felt your baby move for more than 1 hour. If you think the baby is not moving as much as usual, eat something with sugar in it and lie down on your left side for an hour. The baby should move at least 4 to 5 times per hour. Call right away if your baby moves less than that.   You fall, are in a car accident or any kind of trauma.   There is mental or physical violence at home.  Document Released: 02/03/2001 Document Revised: 01/29/2011 Document Reviewed: 08/08/2008 [facility] Patient Information 2012 [place], [place].

## 2011-09-04 LAB — GLUCOSE TOLERANCE, 1 HOUR (50G) W/O FASTING: Glucose, 1 Hour GTT: 136 mg/dL (ref 70–140)

## 2011-09-04 LAB — HIV ANTIBODY (ROUTINE TESTING W REFLEX): HIV: NONREACTIVE

## 2011-09-04 LAB — RPR

## 2011-09-07 NOTE — Progress Notes (Signed)
Appointment was given for 3hr gtt on 09/09/11 at 8am

## 2011-09-09 ENCOUNTER — Other Ambulatory Visit (INDEPENDENT_AMBULATORY_CARE_PROVIDER_SITE_OTHER): Payer: Self-pay | Admitting: *Deleted

## 2011-09-09 DIAGNOSIS — R7309 Other abnormal glucose: Secondary | ICD-10-CM

## 2011-09-10 ENCOUNTER — Encounter: Payer: Self-pay | Admitting: Obstetrics & Gynecology

## 2011-09-10 LAB — GLUCOSE TOLERANCE, 3 HOURS
Glucose Tolerance, 1 hour: 122 mg/dL (ref 70–189)
Glucose Tolerance, 2 hour: 100 mg/dL (ref 70–164)
Glucose Tolerance, Fasting: 72 mg/dL (ref 70–104)
Glucose, GTT - 3 Hour: 116 mg/dL (ref 70–144)

## 2011-09-17 ENCOUNTER — Ambulatory Visit (INDEPENDENT_AMBULATORY_CARE_PROVIDER_SITE_OTHER): Payer: Private Health Insurance - Indemnity | Admitting: Obstetrics & Gynecology

## 2011-09-17 ENCOUNTER — Encounter: Payer: Self-pay | Admitting: Obstetrics & Gynecology

## 2011-09-17 DIAGNOSIS — Z3480 Encounter for supervision of other normal pregnancy, unspecified trimester: Secondary | ICD-10-CM

## 2011-09-17 NOTE — Progress Notes (Signed)
Pt with no complaints.  +FM; no vag bleeding.  No ctx.  NO LOF. Abnl 1 hr GCT.  Nl 3hr GTT Rec f/u in 2 weeks or sooner PRN Plans to breast feed.  Info given on childbirth classes  Leily Capek L. Harraway-Smith, M.D., [facility]

## 2011-09-17 NOTE — Patient Instructions (Signed)
Pregnancy - Third Trimester The third trimester of pregnancy (the last 3 months) is a period of the most rapid growth for you and your baby. The baby approaches a length of 20 inches and a weight of 6 to 10 pounds. The baby is adding on fat and getting ready for life outside your body. While inside, babies have periods of sleeping and waking, suck their thumbs, and hiccups. You can often feel small contractions of the uterus. This is false labor. It is also called Braxton-Hicks contractions. This is like a practice for labor. The usual problems in this stage of pregnancy include more difficulty breathing, swelling of the hands and feet from water retention, and having to urinate more often because of the uterus and baby pressing on your bladder.  PRENATAL EXAMS  Blood work may continue to be done during prenatal exams. These tests are done to check on your health and the probable health of your baby. Blood work is used to follow your blood levels (hemoglobin). Anemia (low hemoglobin) is common during pregnancy. Iron and vitamins are given to help prevent this. You may also continue to be checked for diabetes. Some of the past blood tests may be done again.   The size of the uterus is measured during each visit. This makes sure your baby is growing properly according to your pregnancy dates.   Your blood pressure is checked every prenatal visit. This is to make sure you are not getting toxemia.   Your urine is checked every prenatal visit for infection, diabetes and protein.   Your weight is checked at each visit. This is done to make sure gains are happening at the suggested rate and that you and your baby are growing normally.   Sometimes, an ultrasound is performed to confirm the position and the proper growth and development of the baby. This is a test done that bounces harmless sound waves off the baby so your caregiver can more accurately determine due dates.   Discuss the type of pain  medication and anesthesia you will have during your labor and delivery.   Discuss the possibility and anesthesia if a Cesarean Section might be necessary.   Inform your caregiver if there is any mental or physical violence at home.  Sometimes, a specialized non-stress test, contraction stress test and biophysical profile are done to make sure the baby is not having a problem. Checking the amniotic fluid surrounding the baby is called an amniocentesis. The amniotic fluid is removed by sticking a needle into the belly (abdomen). This is sometimes done near the end of pregnancy if an early delivery is required. In this case, it is done to help make sure the baby's lungs are mature enough for the baby to live outside of the womb. If the lungs are not mature and it is unsafe to deliver the baby, an injection of cortisone medication is given to the mother 1 to 2 days before the delivery. This helps the baby's lungs mature and makes it safer to deliver the baby. CHANGES OCCURING IN THE THIRD TRIMESTER OF PREGNANCY Your body goes through many changes during pregnancy. They vary from person to person. Talk to your caregiver about changes you notice and are concerned about.  During the last trimester, you have probably had an increase in your appetite. It is normal to have cravings for certain foods. This varies from person to person and pregnancy to pregnancy.   You may begin to get stretch marks on your hips,   abdomen, and breasts. These are normal changes in the body during pregnancy. There are no exercises or medications to take which prevent this change.   Constipation may be treated with a stool softener or adding bulk to your diet. Drinking lots of fluids, fiber in vegetables, fruits, and whole grains are helpful.   Exercising is also helpful. If you have been very active up until your pregnancy, most of these activities can be continued during your pregnancy. If you have been less active, it is helpful  to start an exercise program such as walking. Consult your caregiver before starting exercise programs.   Avoid all smoking, alcohol, un-prescribed drugs, herbs and "street drugs" during your pregnancy. These chemicals affect the formation and growth of the baby. Avoid chemicals throughout the pregnancy to ensure the delivery of a healthy infant.   Backache, varicose veins and hemorrhoids may develop or get worse.   You will tire more easily in the third trimester, which is normal.   The baby's movements may be stronger and more often.   You may become short of breath easily.   Your belly button may stick out.   A yellow discharge may leak from your breasts called colostrum.   You may have a bloody mucus discharge. This usually occurs a few days to a week before labor begins.  HOME CARE INSTRUCTIONS   Keep your caregiver's appointments. Follow your caregiver's instructions regarding medication use, exercise, and diet.   During pregnancy, you are providing food for you and your baby. Continue to eat regular, well-balanced meals. Choose foods such as meat, fish, milk and other low fat dairy products, vegetables, fruits, and whole-grain breads and cereals. Your caregiver will tell you of the ideal weight gain.   A physical sexual relationship may be continued throughout pregnancy if there are no other problems such as early (premature) leaking of amniotic fluid from the membranes, vaginal bleeding, or belly (abdominal) pain.   Exercise regularly if there are no restrictions. Check with your caregiver if you are unsure of the safety of your exercises. Greater weight gain will occur in the last 2 trimesters of pregnancy. Exercising helps:   Control your weight.   Get you in shape for labor and delivery.   You lose weight after you deliver.   Rest a lot with legs elevated, or as needed for leg cramps or low back pain.   Wear a good support or jogging bra for breast tenderness during  pregnancy. This may help if worn during sleep. Pads or tissues may be used in the bra if you are leaking colostrum.   Do not use hot tubs, steam rooms, or saunas.   Wear your seat belt when driving. This protects you and your baby if you are in an accident.   Avoid raw meat, cat litter boxes and soil used by cats. These carry germs that can cause birth defects in the baby.   It is easier to loose urine during pregnancy. Tightening up and strengthening the pelvic muscles will help with this problem. You can practice stopping your urination while you are going to the bathroom. These are the same muscles you need to strengthen. It is also the muscles you would use if you were trying to stop from passing gas. You can practice tightening these muscles up 10 times a set and repeating this about 3 times per day. Once you know what muscles to tighten up, do not perform these exercises during urination. It is more likely   to cause an infection by backing up the urine.   Ask for help if you have financial, counseling or nutritional needs during pregnancy. Your caregiver will be able to offer counseling for these needs as well as refer you for other special needs.   Make a list of emergency phone numbers and have them available.   Plan on getting help from family or friends when you go home from the hospital.   Make a trial run to the hospital.   Take prenatal classes with the father to understand, practice and ask questions about the labor and delivery.   Prepare the baby's room/nursery.   Do not travel out of the city unless it is absolutely necessary and with the advice of your caregiver.   Wear only low or no heal shoes to have better balance and prevent falling.  MEDICATIONS AND DRUG USE IN PREGNANCY  Take prenatal vitamins as directed. The vitamin should contain 1 milligram of folic acid. Keep all vitamins out of reach of children. Only a couple vitamins or tablets containing iron may be fatal  to a baby or young child when ingested.   Avoid use of all medications, including herbs, over-the-counter medications, not prescribed or suggested by your caregiver. Only take over-the-counter or prescription medicines for pain, discomfort, or fever as directed by your caregiver. Do not use aspirin, ibuprofen (Motrin, Advil, Nuprin) or naproxen (Aleve) unless OK'd by your caregiver.   Let your caregiver also know about herbs you may be using.   Alcohol is related to a number of birth defects. This includes fetal alcohol syndrome. All alcohol, in any form, should be avoided completely. Smoking will cause low birth rate and premature babies.   Street/illegal drugs are very harmful to the baby. They are absolutely forbidden. A baby born to an addicted mother will be addicted at birth. The baby will go through the same withdrawal an adult does.  SEEK MEDICAL CARE IF: You have any concerns or worries during your pregnancy. It is better to call with your questions if you feel they cannot wait, rather than worry about them. DECISIONS ABOUT CIRCUMCISION You may or may not know the sex of your baby. If you know your baby is a boy, it may be time to think about circumcision. Circumcision is the removal of the foreskin of the penis. This is the skin that covers the sensitive end of the penis. There is no proven medical need for this. Often this decision is made on what is popular at the time or based upon religious beliefs and social issues. You can discuss these issues with your caregiver or pediatrician. SEEK IMMEDIATE MEDICAL CARE IF:   An unexplained oral temperature above 102 F (38.9 C) develops, or as your caregiver suggests.   You have leaking of fluid from the vagina (birth canal). If leaking membranes are suspected, take your temperature and tell your caregiver of this when you call.   There is vaginal spotting, bleeding or passing clots. Tell your caregiver of the amount and how many pads are  used.   You develop a bad smelling vaginal discharge with a change in the color from clear to white.   You develop vomiting that lasts more than 24 hours.   You develop chills or fever.   You develop shortness of breath.   You develop burning on urination.   You loose more than 2 pounds of weight or gain more than 2 pounds of weight or as suggested by your   caregiver.   You notice sudden swelling of your face, hands, and feet or legs.   You develop belly (abdominal) pain. Round ligament discomfort is a common non-cancerous (benign) cause of abdominal pain in pregnancy. Your caregiver still must evaluate you.   You develop a severe headache that does not go away.   You develop visual problems, blurred or double vision.   If you have not felt your baby move for more than 1 hour. If you think the baby is not moving as much as usual, eat something with sugar in it and lie down on your left side for an hour. The baby should move at least 4 to 5 times per hour. Call right away if your baby moves less than that.   You fall, are in a car accident or any kind of trauma.   There is mental or physical violence at home.  Document Released: 02/03/2001 Document Revised: 01/29/2011 Document Reviewed: 08/08/2008 [employer] Patient Information 2012 [employer]. 

## 2011-09-17 NOTE — Progress Notes (Signed)
Routine prenatal check, doing well. 

## 2011-10-01 ENCOUNTER — Encounter: Payer: Self-pay | Admitting: Obstetrics & Gynecology

## 2011-10-01 ENCOUNTER — Ambulatory Visit (INDEPENDENT_AMBULATORY_CARE_PROVIDER_SITE_OTHER): Payer: Private Health Insurance - Indemnity | Admitting: Obstetrics & Gynecology

## 2011-10-01 ENCOUNTER — Encounter: Payer: Self-pay | Admitting: Gynecology

## 2011-10-01 VITALS — BP 94/66 | Wt 177.0 lb

## 2011-10-01 DIAGNOSIS — Z3480 Encounter for supervision of other normal pregnancy, unspecified trimester: Secondary | ICD-10-CM

## 2011-10-01 DIAGNOSIS — Z8619 Personal history of other infectious and parasitic diseases: Secondary | ICD-10-CM | POA: Insufficient documentation

## 2011-10-01 DIAGNOSIS — Z3490 Encounter for supervision of normal pregnancy, unspecified, unspecified trimester: Secondary | ICD-10-CM

## 2011-10-01 NOTE — Progress Notes (Signed)
Routine visit. Good FM. No problems. 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 We have discussed starting Valtrex now versus 35 weeks. She prefers to wait until then.

## 2011-10-15 ENCOUNTER — Ambulatory Visit (INDEPENDENT_AMBULATORY_CARE_PROVIDER_SITE_OTHER): Payer: Private Health Insurance - Indemnity | Admitting: Obstetrics & Gynecology

## 2011-10-15 VITALS — BP 100/66 | Wt 180.0 lb

## 2011-10-15 DIAGNOSIS — Z3480 Encounter for supervision of other normal pregnancy, unspecified trimester: Secondary | ICD-10-CM

## 2011-10-15 DIAGNOSIS — Z8619 Personal history of other infectious and parasitic diseases: Secondary | ICD-10-CM

## 2011-10-15 DIAGNOSIS — Z3490 Encounter for supervision of normal pregnancy, unspecified, unspecified trimester: Secondary | ICD-10-CM

## 2011-10-15 MED ORDER — VALACYCLOVIR HCL 500 MG PO TABS: 500.0000 mg | ORAL_TABLET | Freq: Two times a day (BID) | ORAL | Status: DC

## 2011-10-15 NOTE — Patient Instructions (Signed)
Return to clinic for any obstetric concerns or go to [facility] for evaluation  

## 2011-10-15 NOTE — Progress Notes (Signed)
No complaints or concerns.  Valtrex prescribed, will start next week. Fetal movement and labor precautions reviewed.

## 2011-10-30 ENCOUNTER — Ambulatory Visit (INDEPENDENT_AMBULATORY_CARE_PROVIDER_SITE_OTHER): Payer: Private Health Insurance - Indemnity | Admitting: Obstetrics and Gynecology

## 2011-10-30 VITALS — BP 104/67 | Wt 180.0 lb

## 2011-10-30 DIAGNOSIS — Z3480 Encounter for supervision of other normal pregnancy, unspecified trimester: Secondary | ICD-10-CM

## 2011-10-30 DIAGNOSIS — Z8619 Personal history of other infectious and parasitic diseases: Secondary | ICD-10-CM

## 2011-10-30 DIAGNOSIS — Z3490 Encounter for supervision of normal pregnancy, unspecified, unspecified trimester: Secondary | ICD-10-CM

## 2011-10-30 DIAGNOSIS — L700 Acne vulgaris: Secondary | ICD-10-CM

## 2011-10-30 DIAGNOSIS — L708 Other acne: Secondary | ICD-10-CM

## 2011-10-30 NOTE — Progress Notes (Signed)
Patient doing well without complaints. FM/PTL precautions reviewed. Patient taking valtrex for prophylaxis.

## 2011-11-06 ENCOUNTER — Ambulatory Visit (INDEPENDENT_AMBULATORY_CARE_PROVIDER_SITE_OTHER): Payer: Private Health Insurance - Indemnity | Admitting: Family Medicine

## 2011-11-06 ENCOUNTER — Encounter: Payer: Self-pay | Admitting: Family Medicine

## 2011-11-06 VITALS — BP 105/56 | Wt 185.0 lb

## 2011-11-06 DIAGNOSIS — Z3400 Encounter for supervision of normal first pregnancy, unspecified trimester: Secondary | ICD-10-CM

## 2011-11-06 LAB — OB RESULTS CONSOLE GBS: GBS: POSITIVE

## 2011-11-06 NOTE — Patient Instructions (Addendum)
Breastfeeding BENEFITS OF BREASTFEEDING For the baby  The first milk (colostrum) helps the baby's digestive system function better.   There are antibodies from the mother in the milk that help the baby fight off infections.   The baby has a lower incidence of asthma, allergies, and SIDS (sudden infant death syndrome).   The nutrients in breast milk are better than formulas for the baby and helps the baby's brain grow better.   Babies who breastfeed have less gas, colic, and constipation.  For the mother  Breastfeeding helps develop a very special bond between mother and baby.   It is more convenient, always available at the correct temperature and cheaper than formula feeding.   It burns calories in the mother and helps with losing weight that was gained during pregnancy.   It makes the uterus contract back down to normal size faster and slows bleeding following delivery.   Breastfeeding mothers have a lower risk of developing breast cancer.  NURSE FREQUENTLY  A healthy, full-term baby may breastfeed as often as every hour or space his or her feedings to every 3 hours.   How often to nurse will vary from baby to baby. Watch your baby for signs of hunger, not the clock.   Nurse as often as the baby requests, or when you feel the need to reduce the fullness of your breasts.   Awaken the baby if it has been 3 to 4 hours since the last feeding.   Frequent feeding will help the mother make more milk and will prevent problems like sore nipples and engorgement of the breasts.  BABY'S POSITION AT THE BREAST  Whether lying down or sitting, be sure that the baby's tummy is facing your tummy.   Support the breast with 4 fingers underneath the breast and the thumb above. Make sure your fingers are well away from the nipple and baby's mouth.   Stroke the baby's lips and cheek closest to the breast gently with your finger or nipple.   When the baby's mouth is open wide enough, place  all of your nipple and as much of the dark area around the nipple as possible into your baby's mouth.   Pull the baby in close so the tip of the nose and the baby's cheeks touch the breast during the feeding.  FEEDINGS  The length of each feeding varies from baby to baby and from feeding to feeding.   The baby must suck about 2 to 3 minutes for your milk to get to him or her. This is called a "let down." For this reason, allow the baby to feed on each breast as long as he or she wants. Your baby will end the feeding when he or she has received the right balance of nutrients.   To break the suction, put your finger into the corner of the baby's mouth and slide it between his or her gums before removing your breast from his or her mouth. This will help prevent sore nipples.  REDUCING BREAST ENGORGEMENT  In the first week after your baby is born, you may experience signs of breast engorgement. When breasts are engorged, they feel heavy, warm, full, and may be tender to the touch. You can reduce engorgement if you:   Nurse frequently, every 2 to 3 hours. Mothers who breastfeed early and often have fewer problems with engorgement.   Place light ice packs on your breasts between feedings. This reduces swelling. Wrap the ice packs in a   lightweight towel to protect your skin.   Apply moist hot packs to your breast for 5 to 10 minutes before each feeding. This increases circulation and helps the milk flow.   Gently massage your breast before and during the feeding.   Make sure that the baby empties at least one breast at every feeding before switching sides.   Use a breast pump to empty the breasts if your baby is sleepy or not nursing well. You may also want to pump if you are returning to work or or you feel you are getting engorged.   Avoid bottle feeds, pacifiers or supplemental feedings of water or juice in place of breastfeeding.   Be sure the baby is latched on and positioned properly while  breastfeeding.   Prevent fatigue, stress, and anemia.   Wear a supportive bra, avoiding underwire styles.   Eat a balanced diet with enough fluids.  If you follow these suggestions, your engorgement should improve in 24 to 48 hours. If you are still experiencing difficulty, call your lactation consultant or caregiver. IS MY BABY GETTING ENOUGH MILK? Sometimes, mothers worry about whether their babies are getting enough milk. You can be assured that your baby is getting enough milk if:  The baby is actively sucking and you hear swallowing.   The baby nurses at least 8 to 12 times in a 24 hour time period. Nurse your baby until he or she unlatches or falls asleep at the first breast (at least 10 to 20 minutes), then offer the second side.   The baby is wetting 5 to 6 disposable diapers (6 to 8 cloth diapers) in a 24 hour period by 5 to 6 days of age.   The baby is having at least 2 to 3 stools every 24 hours for the first few months. Breast milk is all the food your baby needs. It is not necessary for your baby to have water or formula. In fact, to help your breasts make more milk, it is best not to give your baby supplemental feedings during the early weeks.   The stool should be soft and yellow.   The baby should gain 4 to 7 ounces per week after he is 4 days old.  TAKE CARE OF YOURSELF Take care of your breasts by:  Bathing or showering daily.   Avoiding the use of soaps on your nipples.   Start feedings on your left breast at one feeding and on your right breast at the next feeding.   You will notice an increase in your milk supply 2 to 5 days after delivery. You may feel some discomfort from engorgement, which makes your breasts very firm and often tender. Engorgement "peaks" out within 24 to 48 hours. In the meantime, apply warm moist towels to your breasts for 5 to 10 minutes before feeding. Gentle massage and expression of some milk before feeding will soften your breasts, making  it easier for your baby to latch on. Wear a well fitting nursing bra and air dry your nipples for 10 to 15 minutes after each feeding.   Only use cotton bra pads.   Only use pure lanolin on your nipples after nursing. You do not need to wash it off before nursing.  Take care of yourself by:   Eating well-balanced meals and nutritious snacks.   Drinking milk, fruit juice, and water to satisfy your thirst (about 8 glasses a day).   Getting plenty of rest.   Increasing calcium in   your diet (1200 mg a day).   Avoiding foods that you notice affect the baby in a bad way.  SEEK MEDICAL CARE IF:   You have any questions or difficulty with breastfeeding.   You need help.   You have a hard, red, sore area on your breast, accompanied by a fever of 100.5 F (38.1 C) or more.   Your baby is too sleepy to eat well or is having trouble sleeping.   Your baby is wetting less than 6 diapers per day, by 5 days of age.   Your baby's skin or white part of his or her eyes is more yellow than it was in the hospital.   You feel depressed.  Document Released: 02/09/2005 Document Revised: 01/29/2011 Document Reviewed: 09/24/2008 [employer] Patient Information 2012 [employer]. Normal Labor and Delivery Your caregiver must first be sure you are in labor. Signs of labor include:  You may pass what is called "the mucus plug" before labor begins. This is a small amount of blood stained mucus.   Regular uterine contractions.   The time between contractions get closer together.   The discomfort and pain gradually gets more intense.   Pains are mostly located in the back.   Pains get worse when walking.   The cervix (the opening of the uterus becomes thinner (begins to efface) and opens up (dilates).  Once you are in labor and admitted into the hospital or care center, your caregiver will do the following:  A complete physical examination.   Check your vital signs (blood pressure, pulse,  temperature and the fetal heart rate).   Do a vaginal examination (using a sterile glove and lubricant) to determine:   The position (presentation) of the baby (head [vertex] or buttock first).   The level (station) of the baby's head in the birth canal.   The effacement and dilatation of the cervix.   You may have your pubic hair shaved and be given an enema depending on your caregiver and the circumstance.   An electronic monitor is usually placed on your abdomen. The monitor follows the length and intensity of the contractions, as well as the baby's heart rate.   Usually, your caregiver will insert an IV in your arm with a bottle of sugar water. This is done as a precaution so that medications can be given to you quickly during labor or delivery.  NORMAL LABOR AND DELIVERY IS DIVIDED UP INTO 3 STAGES: First Stage This is when regular contractions begin and the cervix begins to efface and dilate. This stage can last from 3 to 15 hours. The end of the first stage is when the cervix is 100% effaced and 10 centimeters dilated. Pain medications may be given by   Injection (morphine, demerol, etc.)   Regional anesthesia (spinal, caudal or epidural, anesthetics given in different locations of the spine). Paracervical pain medication may be given, which is an injection of and anesthetic on each side of the cervix.  A pregnant woman may request to have "Natural Childbirth" which is not to have any medications or anesthesia during her labor and delivery. Second Stage This is when the baby comes down through the birth canal (vagina) and is born. This can take 1 to 4 hours. As the baby's head comes down through the birth canal, you may feel like you are going to have a bowel movement. You will get the urge to bear down and push until the baby is delivered. As the baby's   head is being delivered, the caregiver will decide if an episiotomy (a cut in the perineum and vagina area) is needed to prevent  tearing of the tissue in this area. The episiotomy is sewn up after the delivery of the baby and placenta. Sometimes a mask with nitrous oxide is given for the mother to breath during the delivery of the baby to help if there is too much pain. The end of Stage 2 is when the baby is fully delivered. Then when the umbilical cord stops pulsating it is clamped and cut. Third Stage The third stage begins after the baby is completely delivered and ends after the placenta (afterbirth) is delivered. This usually takes 5 to 30 minutes. After the placenta is delivered, a medication is given either by intravenous or injection to help contract the uterus and prevent bleeding. The third stage is not painful and pain medication is usually not necessary. If an episiotomy was done, it is repaired at this time. After the delivery, the mother is watched and monitored closely for 1 to 2 hours to make sure there is no postpartum bleeding (hemorrhage). If there is a lot of bleeding, medication is given to contract the uterus and stop the bleeding. Document Released: 11/19/2007 Document Revised: 01/29/2011 Document Reviewed: 11/19/2007 [employer] Patient Information 2012 [employer].  

## 2011-11-06 NOTE — Progress Notes (Signed)
Cultures today Doing well 

## 2011-11-07 LAB — GC/CHLAMYDIA PROBE AMP, GENITAL
Chlamydia, DNA Probe: NEGATIVE
GC Probe Amp, Genital: NEGATIVE

## 2011-11-11 ENCOUNTER — Encounter: Payer: Private Health Insurance - Indemnity | Admitting: Family Medicine

## 2011-11-11 ENCOUNTER — Encounter: Payer: Self-pay | Admitting: Family Medicine

## 2011-11-11 DIAGNOSIS — O99820 Streptococcus B carrier state complicating pregnancy: Secondary | ICD-10-CM | POA: Insufficient documentation

## 2011-11-11 LAB — CULTURE, BETA STREP (GROUP B ONLY)

## 2011-11-13 ENCOUNTER — Ambulatory Visit (INDEPENDENT_AMBULATORY_CARE_PROVIDER_SITE_OTHER): Payer: Private Health Insurance - Indemnity | Admitting: Obstetrics & Gynecology

## 2011-11-13 VITALS — BP 96/70 | Wt 186.0 lb

## 2011-11-13 DIAGNOSIS — Z3490 Encounter for supervision of normal pregnancy, unspecified, unspecified trimester: Secondary | ICD-10-CM

## 2011-11-13 DIAGNOSIS — Z3480 Encounter for supervision of other normal pregnancy, unspecified trimester: Secondary | ICD-10-CM

## 2011-11-13 NOTE — Patient Instructions (Signed)
Normal Labor and Delivery Your caregiver must first be sure you are in labor. Signs of labor include:  You may pass what is called "the mucus plug" before labor begins. This is a small amount of blood stained mucus.   Regular uterine contractions.   The time between contractions get closer together.   The discomfort and pain gradually gets more intense.   Pains are mostly located in the back.   Pains get worse when walking.   The cervix (the opening of the uterus becomes thinner (begins to efface) and opens up (dilates).  Once you are in labor and admitted into the hospital or care center, your caregiver will do the following:  A complete physical examination.   Check your vital signs (blood pressure, pulse, temperature and the fetal heart rate).   Do a vaginal examination (using a sterile glove and lubricant) to determine:   The position (presentation) of the baby (head [vertex] or buttock first).   The level (station) of the baby's head in the birth canal.   The effacement and dilatation of the cervix.   You may have your pubic hair shaved and be given an enema depending on your caregiver and the circumstance.   An electronic monitor is usually placed on your abdomen. The monitor follows the length and intensity of the contractions, as well as the baby's heart rate.   Usually, your caregiver will insert an IV in your arm with a bottle of sugar water. This is done as a precaution so that medications can be given to you quickly during labor or delivery.  NORMAL LABOR AND DELIVERY IS DIVIDED UP INTO 3 STAGES: First Stage This is when regular contractions begin and the cervix begins to efface and dilate. This stage can last from 3 to 15 hours. The end of the first stage is when the cervix is 100% effaced and 10 centimeters dilated. Pain medications may be given by   Injection (morphine, demerol, etc.)   Regional anesthesia (spinal, caudal or epidural, anesthetics given in  different locations of the spine). Paracervical pain medication may be given, which is an injection of and anesthetic on each side of the cervix.  A pregnant woman may request to have "Natural Childbirth" which is not to have any medications or anesthesia during her labor and delivery. Second Stage This is when the baby comes down through the birth canal (vagina) and is born. This can take 1 to 4 hours. As the baby's head comes down through the birth canal, you may feel like you are going to have a bowel movement. You will get the urge to bear down and push until the baby is delivered. As the baby's head is being delivered, the caregiver will decide if an episiotomy (a cut in the perineum and vagina area) is needed to prevent tearing of the tissue in this area. The episiotomy is sewn up after the delivery of the baby and placenta. Sometimes a mask with nitrous oxide is given for the mother to breath during the delivery of the baby to help if there is too much pain. The end of Stage 2 is when the baby is fully delivered. Then when the umbilical cord stops pulsating it is clamped and cut. Third Stage The third stage begins after the baby is completely delivered and ends after the placenta (afterbirth) is delivered. This usually takes 5 to 30 minutes. After the placenta is delivered, a medication is given either by intravenous or injection to help contract   the uterus and prevent bleeding. The third stage is not painful and pain medication is usually not necessary. If an episiotomy was done, it is repaired at this time. After the delivery, the mother is watched and monitored closely for 1 to 2 hours to make sure there is no postpartum bleeding (hemorrhage). If there is a lot of bleeding, medication is given to contract the uterus and stop the bleeding. Document Released: 11/19/2007 Document Revised: 01/29/2011 Document Reviewed: 11/19/2007 [employer] Patient Information 2012 [employer]. 

## 2011-11-13 NOTE — Progress Notes (Signed)
Pelvic pressure and occasional contractions. No blood or LOF. No sx of herpes. GBS positive

## 2011-11-19 ENCOUNTER — Ambulatory Visit (INDEPENDENT_AMBULATORY_CARE_PROVIDER_SITE_OTHER): Payer: Private Health Insurance - Indemnity | Admitting: Obstetrics & Gynecology

## 2011-11-19 ENCOUNTER — Encounter: Payer: Self-pay | Admitting: Obstetrics & Gynecology

## 2011-11-19 VITALS — BP 104/77 | Wt 188.0 lb

## 2011-11-19 DIAGNOSIS — Z3490 Encounter for supervision of normal pregnancy, unspecified, unspecified trimester: Secondary | ICD-10-CM

## 2011-11-19 DIAGNOSIS — Z8619 Personal history of other infectious and parasitic diseases: Secondary | ICD-10-CM

## 2011-11-19 DIAGNOSIS — Z3480 Encounter for supervision of other normal pregnancy, unspecified trimester: Secondary | ICD-10-CM

## 2011-11-19 NOTE — Progress Notes (Signed)
Routine visit. 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 She has only had one outbreak 5 years ago. She is taking Valtrex 500 mg BID. On exam I don't see any lesions, but I told her to take 1 gram BID and that if she feels like she has an outbreak at time of labor, then I would recommend a PLTCS. She understands. Good FM, Labor precautions reviews.

## 2011-11-26 ENCOUNTER — Ambulatory Visit (INDEPENDENT_AMBULATORY_CARE_PROVIDER_SITE_OTHER): Payer: Private Health Insurance - Indemnity | Admitting: Obstetrics & Gynecology

## 2011-11-26 VITALS — BP 100/72 | Wt 189.0 lb

## 2011-11-26 DIAGNOSIS — Z3480 Encounter for supervision of other normal pregnancy, unspecified trimester: Secondary | ICD-10-CM

## 2011-11-26 DIAGNOSIS — O99820 Streptococcus B carrier state complicating pregnancy: Secondary | ICD-10-CM

## 2011-11-26 DIAGNOSIS — Z8619 Personal history of other infectious and parasitic diseases: Secondary | ICD-10-CM

## 2011-11-26 DIAGNOSIS — Z3490 Encounter for supervision of normal pregnancy, unspecified, unspecified trimester: Secondary | ICD-10-CM

## 2011-11-26 DIAGNOSIS — O09899 Supervision of other high risk pregnancies, unspecified trimester: Secondary | ICD-10-CM

## 2011-11-26 DIAGNOSIS — Z22330 Carrier of Group B streptococcus: Secondary | ICD-10-CM

## 2011-11-26 NOTE — Patient Instructions (Signed)
Return to clinic for any obstetric concerns or go to [facility] for evaluation  

## 2011-11-27 NOTE — Progress Notes (Signed)
Still feels like she is having pain near her clitoris. On examination of the labia major junction area above her clitoris (area of pain), it looks like folliculitis lesion similar to other lesions present all over her mons pubis. No vesicles, ulcers or other typical HSV characteristics. Also patient has been taking Valtrex 1g po bid since last visit.  She was told it was likely not herpetic, but to continue Valtrex 1g po bid until delivery. Will continue to monitor lesion. If it becomes more concerning, may need PLTCS.  No other complaints or concerns.  Fetal movement and labor precautions reviewed.  Start postdates testing next week.

## 2011-12-02 ENCOUNTER — Other Ambulatory Visit: Payer: Private Health Insurance - Indemnity | Admitting: *Deleted

## 2011-12-02 NOTE — Progress Notes (Signed)
Patient is having increased discharge and wanted to see if it might be her water leaking.  She is wetting her panties but it is not running down her leg.  Ph test was done with swab and there was no change of color.  Patient was reassured and she will keep her appointment tomorrow to have her cervix checked and see the physician.

## 2011-12-03 ENCOUNTER — Ambulatory Visit (INDEPENDENT_AMBULATORY_CARE_PROVIDER_SITE_OTHER): Payer: Private Health Insurance - Indemnity | Admitting: Family Medicine

## 2011-12-03 ENCOUNTER — Encounter: Payer: Self-pay | Admitting: Family Medicine

## 2011-12-03 VITALS — BP 99/72 | Wt 190.0 lb

## 2011-12-03 DIAGNOSIS — O480 Post-term pregnancy: Secondary | ICD-10-CM

## 2011-12-03 DIAGNOSIS — Z3480 Encounter for supervision of other normal pregnancy, unspecified trimester: Secondary | ICD-10-CM

## 2011-12-03 NOTE — Progress Notes (Signed)
Doing well--having some SOB with lying down but no true orthopnea, no swelling. Cervix is 1/2 cm externally only NST today AFI and NST next week. Schedule IOL at 41+ wks.

## 2011-12-04 ENCOUNTER — Telehealth (HOSPITAL_COMMUNITY): Payer: Self-pay | Admitting: *Deleted

## 2011-12-04 ENCOUNTER — Encounter (HOSPITAL_COMMUNITY): Payer: Self-pay | Admitting: *Deleted

## 2011-12-04 NOTE — Telephone Encounter (Signed)
Preadmission screen  

## 2011-12-08 ENCOUNTER — Ambulatory Visit (INDEPENDENT_AMBULATORY_CARE_PROVIDER_SITE_OTHER): Payer: Private Health Insurance - Indemnity | Admitting: Obstetrics & Gynecology

## 2011-12-08 ENCOUNTER — Encounter: Payer: Self-pay | Admitting: Obstetrics & Gynecology

## 2011-12-08 VITALS — BP 105/74 | Wt 189.0 lb

## 2011-12-08 DIAGNOSIS — O480 Post-term pregnancy: Secondary | ICD-10-CM

## 2011-12-08 DIAGNOSIS — Z3480 Encounter for supervision of other normal pregnancy, unspecified trimester: Secondary | ICD-10-CM

## 2011-12-08 DIAGNOSIS — Z23 Encounter for immunization: Secondary | ICD-10-CM

## 2011-12-08 NOTE — Progress Notes (Signed)
Doing well, scheduled for induction tomorrow, she has had increased headaches, but patient has a history of migraine and she is not having any other symptoms.

## 2011-12-08 NOTE — Addendum Note (Signed)
Addended by: Barbara Cower on: 12/08/2011 04:49 PM   Modules accepted: Orders

## 2011-12-08 NOTE — Progress Notes (Signed)
Routine OB. NST reactive    IOL tomorrow. Good FM. Flu vaccine today. Labor precautions. She declines a cervical exam today.

## 2011-12-09 ENCOUNTER — Encounter (HOSPITAL_COMMUNITY): Payer: Self-pay | Admitting: Anesthesiology

## 2011-12-09 ENCOUNTER — Inpatient Hospital Stay (HOSPITAL_COMMUNITY): Payer: Managed Care, Other (non HMO) | Admitting: Anesthesiology

## 2011-12-09 ENCOUNTER — Inpatient Hospital Stay (HOSPITAL_COMMUNITY)
Admission: RE | Admit: 2011-12-09 | Discharge: 2011-12-13 | DRG: 775 | Disposition: A | Discharge status: Home / Self Care | Payer: Managed Care, Other (non HMO) | Source: Ambulatory Visit | Attending: Obstetrics & Gynecology | Admitting: Obstetrics & Gynecology

## 2011-12-09 ENCOUNTER — Encounter (HOSPITAL_COMMUNITY): Payer: Self-pay

## 2011-12-09 VITALS — BP 109/72 | HR 90 | Temp 98.3°F | Resp 18 | Ht 62.0 in | Wt 189.0 lb

## 2011-12-09 DIAGNOSIS — O99820 Streptococcus B carrier state complicating pregnancy: Secondary | ICD-10-CM

## 2011-12-09 DIAGNOSIS — Z3490 Encounter for supervision of normal pregnancy, unspecified, unspecified trimester: Secondary | ICD-10-CM

## 2011-12-09 DIAGNOSIS — L700 Acne vulgaris: Secondary | ICD-10-CM

## 2011-12-09 DIAGNOSIS — O480 Post-term pregnancy: Principal | ICD-10-CM | POA: Diagnosis present

## 2011-12-09 DIAGNOSIS — D649 Anemia, unspecified: Secondary | ICD-10-CM | POA: Diagnosis not present

## 2011-12-09 DIAGNOSIS — O9903 Anemia complicating the puerperium: Secondary | ICD-10-CM | POA: Diagnosis not present

## 2011-12-09 DIAGNOSIS — O411090 Infection of amniotic sac and membranes, unspecified, unspecified trimester, not applicable or unspecified: Secondary | ICD-10-CM | POA: Diagnosis present

## 2011-12-09 DIAGNOSIS — Z8619 Personal history of other infectious and parasitic diseases: Secondary | ICD-10-CM

## 2011-12-09 LAB — RPR: RPR Ser Ql: NONREACTIVE

## 2011-12-09 LAB — CBC
MCH: 29.8 pg (ref 26.0–34.0)
MCHC: 33 g/dL (ref 30.0–36.0)
RDW: 15.8 % — ABNORMAL HIGH (ref 11.5–15.5)

## 2011-12-09 MED ORDER — OXYTOCIN 40 UNITS IN LACTATED RINGERS INFUSION - SIMPLE MED
62.5000 mL/h | Freq: Once | INTRAVENOUS | Status: DC
  Filled 2011-12-09: qty 1000

## 2011-12-09 MED ORDER — FENTANYL 2.5 MCG/ML BUPIVACAINE 1/10 % EPIDURAL INFUSION (WH - ANES)
14.0000 mL/h | INTRAMUSCULAR | Status: DC
  Administered 2011-12-10 – 2011-12-11 (×4): 14 mL/h via EPIDURAL
  Filled 2011-12-09 (×6): qty 125

## 2011-12-09 MED ORDER — DIPHENHYDRAMINE HCL 50 MG/ML IJ SOLN: 12.5000 mg | INTRAMUSCULAR | Status: DC | PRN

## 2011-12-09 MED ORDER — LACTATED RINGERS IV SOLN
500.0000 mL | Freq: Once | INTRAVENOUS | Status: AC
  Administered 2011-12-09: 500 mL via INTRAVENOUS

## 2011-12-09 MED ORDER — EPHEDRINE 5 MG/ML INJ: 10.0000 mg | INTRAVENOUS | Status: DC | PRN

## 2011-12-09 MED ORDER — NALBUPHINE SYRINGE 5 MG/0.5 ML
5.0000 mg | INJECTION | Freq: Once | INTRAMUSCULAR | Status: AC
  Administered 2011-12-09: 5 mg via INTRAVENOUS
  Filled 2011-12-09: qty 0.5

## 2011-12-09 MED ORDER — MISOPROSTOL 25 MCG QUARTER TABLET
25.0000 ug | ORAL_TABLET | ORAL | Status: DC | PRN
  Administered 2011-12-09: 25 ug via VAGINAL
  Filled 2011-12-09: qty 0.25
  Filled 2011-12-09: qty 1
  Filled 2011-12-09: qty 0.25

## 2011-12-09 MED ORDER — OXYCODONE-ACETAMINOPHEN 5-325 MG PO TABS
1.0000 | ORAL_TABLET | ORAL | Status: DC | PRN
  Administered 2011-12-11: 1 via ORAL
  Filled 2011-12-09: qty 1

## 2011-12-09 MED ORDER — FENTANYL 2.5 MCG/ML BUPIVACAINE 1/10 % EPIDURAL INFUSION (WH - ANES)
INTRAMUSCULAR | Status: DC | PRN
  Administered 2011-12-09: 14 mL/h via EPIDURAL

## 2011-12-09 MED ORDER — PHENYLEPHRINE 40 MCG/ML (10ML) SYRINGE FOR IV PUSH (FOR BLOOD PRESSURE SUPPORT)
80.0000 ug | PREFILLED_SYRINGE | INTRAVENOUS | Status: DC | PRN
  Filled 2011-12-09: qty 5

## 2011-12-09 MED ORDER — IBUPROFEN 600 MG PO TABS
600.0000 mg | ORAL_TABLET | Freq: Four times a day (QID) | ORAL | Status: DC | PRN
  Administered 2011-12-11: 600 mg via ORAL
  Filled 2011-12-09: qty 1

## 2011-12-09 MED ORDER — LIDOCAINE HCL (PF) 1 % IJ SOLN
INTRAMUSCULAR | Status: DC | PRN
  Administered 2011-12-09 (×2): 9 mL
  Administered 2011-12-11: 30 mL

## 2011-12-09 MED ORDER — ONDANSETRON HCL 4 MG/2ML IJ SOLN: 4.0000 mg | Freq: Four times a day (QID) | INTRAMUSCULAR | Status: DC | PRN

## 2011-12-09 MED ORDER — ACETAMINOPHEN 325 MG PO TABS
650.0000 mg | ORAL_TABLET | ORAL | Status: DC | PRN
  Administered 2011-12-10: 650 mg via ORAL
  Filled 2011-12-09: qty 2

## 2011-12-09 MED ORDER — LACTATED RINGERS IV SOLN
INTRAVENOUS | Status: DC
  Administered 2011-12-09 – 2011-12-11 (×6): via INTRAVENOUS

## 2011-12-09 MED ORDER — NALBUPHINE SYRINGE 5 MG/0.5 ML
5.0000 mg | INJECTION | Freq: Once | INTRAMUSCULAR | Status: AC
  Administered 2011-12-09: 5 mg via INTRAVENOUS

## 2011-12-09 MED ORDER — PENICILLIN G POTASSIUM 5000000 UNITS IJ SOLR
5.0000 10*6.[IU] | Freq: Once | INTRAVENOUS | Status: DC
  Administered 2011-12-09: 5 10*6.[IU] via INTRAVENOUS
  Filled 2011-12-09: qty 5

## 2011-12-09 MED ORDER — PENICILLIN G POTASSIUM 5000000 UNITS IJ SOLR
2.5000 10*6.[IU] | INTRAVENOUS | Status: DC
  Administered 2011-12-09 – 2011-12-11 (×9): 2.5 10*6.[IU] via INTRAVENOUS
  Filled 2011-12-09 (×14): qty 2.5

## 2011-12-09 MED ORDER — TERBUTALINE SULFATE 1 MG/ML IJ SOLN: 0.2500 mg | Freq: Once | INTRAMUSCULAR | Status: AC | PRN

## 2011-12-09 MED ORDER — CITRIC ACID-SODIUM CITRATE 334-500 MG/5ML PO SOLN: 30.0000 mL | ORAL | Status: DC | PRN

## 2011-12-09 MED ORDER — OXYTOCIN BOLUS FROM INFUSION
500.0000 mL | Freq: Once | INTRAVENOUS | Status: AC
  Administered 2011-12-11: 500 mL via INTRAVENOUS
  Filled 2011-12-09: qty 500

## 2011-12-09 MED ORDER — VALACYCLOVIR HCL 500 MG PO TABS
500.0000 mg | ORAL_TABLET | Freq: Two times a day (BID) | ORAL | Status: DC
  Administered 2011-12-09 – 2011-12-13 (×7): 500 mg via ORAL
  Filled 2011-12-09 (×10): qty 1

## 2011-12-09 MED ORDER — PHENYLEPHRINE 40 MCG/ML (10ML) SYRINGE FOR IV PUSH (FOR BLOOD PRESSURE SUPPORT): 80.0000 ug | PREFILLED_SYRINGE | INTRAVENOUS | Status: DC | PRN

## 2011-12-09 MED ORDER — EPHEDRINE 5 MG/ML INJ
10.0000 mg | INTRAVENOUS | Status: DC | PRN
  Filled 2011-12-09: qty 4

## 2011-12-09 MED ORDER — LIDOCAINE HCL (PF) 1 % IJ SOLN
30.0000 mL | INTRAMUSCULAR | Status: DC | PRN
  Filled 2011-12-09: qty 30

## 2011-12-09 MED ORDER — NALBUPHINE SYRINGE 5 MG/0.5 ML
INJECTION | INTRAMUSCULAR | Status: AC
  Administered 2011-12-09: 5 mg via INTRAVENOUS
  Filled 2011-12-09: qty 0.5

## 2011-12-09 MED ORDER — LACTATED RINGERS IV SOLN: 500.0000 mL | INTRAVENOUS | Status: DC | PRN

## 2011-12-09 MED ORDER — OXYTOCIN 40 UNITS IN LACTATED RINGERS INFUSION - SIMPLE MED
1.0000 m[IU]/min | INTRAVENOUS | Status: DC
  Administered 2011-12-09: 2 m[IU]/min via INTRAVENOUS
  Filled 2011-12-09: qty 1000

## 2011-12-09 NOTE — H&P (Signed)
Attestation of Attending Supervision of Resident: Evaluation and management procedures were performed by the [facility] Resident under my supervision.  I have seen and examined the patient, reviewed the resident's note and chart, and I agree with the management and plan.  Anibal Henderson, M.D. 12/09/2011 2:25 PM

## 2011-12-09 NOTE — H&P (Signed)
Norma Fuentes is a 26 y.o. female G2P0010 [redacted]w[redacted]d presenting for IOL for postdates. Patient has no complaints. Only feeling mild contracts every 30 minutes. She has had no complications with this pregnancy. She is GBS positive. She denies fever, leaking or gush of fluid. No vaginal bleeding. She has felt her baby move. She would like to have an epidural when she is in active labor and in pain. Maternal Medical History:  Contractions: Frequency: irregular.   Perceived severity is mild.    Fetal activity: Perceived fetal activity is normal.   Last perceived fetal movement was within the past hour.      OB History    Grav Para Term Preterm Abortions TAB SAB Ect Mult Living   2 0 0 0 1 0 1 0 0 0      Past Medical History  Diagnosis Date  . Headache   . Herpes    History reviewed. No pertinent past surgical history. Family History: family history includes Diabetes in her paternal grandmother and Endometriosis in her maternal aunt. Social History:  reports that she quit smoking about 8 months ago. Her smoking use included Cigarettes. She quit after 5 years of use. She has never used smokeless tobacco. She reports that she does not drink alcohol or use illicit drugs.   Prenatal Transfer Tool  Maternal Diabetes: No Genetic Screening: Normal Maternal Ultrasounds/Referrals: Normal Fetal Ultrasounds or other Referrals:  None Maternal Substance Abuse:  No Significant Maternal Medications:  None Significant Maternal Lab Results:  Lab values include: Group B Strep positive Other Comments:  None  Review of Systems  All other systems reviewed and are negative.    Dilation: 1 Effacement (%): Thick Station: -3 Exam by:: harraway-smith Blood pressure 116/75, pulse 107, temperature 98 F (36.7 C), temperature source Oral, resp. rate 20, height 5\' 2"  (1.575 m), weight 85.73 kg (189 lb), last menstrual period 03/01/2011. Maternal Exam:  Uterine Assessment: Contraction strength is mild.   Contraction duration is 10 minutes. Contraction frequency is irregular.   Abdomen: Fetal presentation: vertex  Introitus: Normal vulva. Normal vagina.  Vagina is negative for discharge.  Cervix: Cervix evaluated by digital exam.     Fetal Exam Fetal Monitor Review: Variability: moderate (6-25 bpm).   Pattern: accelerations present and no decelerations.    Fetal State Assessment: Category I - tracings are normal.     Physical Exam  Constitutional: She is oriented to person, place, and time. She appears well-developed and well-nourished. No distress.  HENT:  Head: Normocephalic and atraumatic.  Eyes: Conjunctivae normal and EOM are normal. Pupils are equal, round, and reactive to light. Right eye exhibits no discharge. Left eye exhibits no discharge. No scleral icterus.  Neck: Normal range of motion. Neck supple.  Cardiovascular: Normal rate, regular rhythm and normal heart sounds.  Exam reveals no gallop and no friction rub.   No murmur heard. Respiratory: Effort normal and breath sounds normal. No respiratory distress. She has no wheezes. She has no rales.  GI: Soft. Bowel sounds are normal.       Gravid   Genitourinary: Vagina normal. No vaginal discharge found.  Musculoskeletal: She exhibits no edema and no tenderness.  Neurological: She is alert and oriented to person, place, and time.  Skin: Skin is warm and dry.  Psychiatric: She has a normal mood and affect.    Prenatal labs: ABO, Rh: O/POS/-- (02/11 1610) Antibody: NEG (02/11 0942) Rubella: 38.5 (02/11 0942) RPR: NON REAC (07/11 1342)  HBsAg: NEGATIVE (02/11 0942)  HIV: NON REACTIVE (07/11 1342)  GBS: Positive (09/13 0000)   Assessment/Plan: 1. Admit to LD with routine orders 2. GBS positive - PCN G started 3. Cytotec Q 4 hours until cervical ripening has been achieved  4. Regular diet until midnight, active labor achieved or Pitocin started 5. Expectant management   Felix Pacini 12/09/2011, 9:32  AM

## 2011-12-09 NOTE — Anesthesia Preprocedure Evaluation (Signed)
Anesthesia Evaluation  Patient identified by MRN, date of birth, ID band Patient awake    Reviewed: Allergy & Precautions, H&P , NPO status , Patient's Chart, lab work & pertinent test results  Airway Mallampati: I TM Distance: >3 FB Neck ROM: full    Dental No notable dental hx.    Pulmonary neg pulmonary ROS,  breath sounds clear to auscultation  Pulmonary exam normal       Cardiovascular negative cardio ROS      Neuro/Psych negative neurological ROS  negative psych ROS   GI/Hepatic negative GI ROS, Neg liver ROS,   Endo/Other  negative endocrine ROS  Renal/GU negative Renal ROS     Musculoskeletal negative musculoskeletal ROS (+)   Abdominal Normal abdominal exam  (+)   Peds negative pediatric ROS (+)  Hematology negative hematology ROS (+)   Anesthesia Other Findings   Reproductive/Obstetrics (+) Pregnancy                           Anesthesia Physical Anesthesia Plan  ASA: II  Anesthesia Plan: Epidural   Post-op Pain Management:    Induction:   Airway Management Planned:   Additional Equipment:   Intra-op Plan:   Post-operative Plan:   Informed Consent: I have reviewed the patients History and Physical, chart, labs and discussed the procedure including the risks, benefits and alternatives for the proposed anesthesia with the patient or authorized representative who has indicated his/her understanding and acceptance.     Plan Discussed with:   Anesthesia Plan Comments:         Anesthesia Quick Evaluation  

## 2011-12-09 NOTE — Anesthesia Procedure Notes (Signed)
Epidural Patient location during procedure: OB Start time: 12/09/2011 6:14 PM End time: 12/09/2011 6:17 PM  Staffing Anesthesiologist: Sandrea Hughs Performed by: anesthesiologist   Preanesthetic Checklist Completed: patient identified, site marked, surgical consent, pre-op evaluation, timeout performed, IV checked, risks and benefits discussed and monitors and equipment checked  Epidural Patient position: sitting Prep: site prepped and draped and DuraPrep Patient monitoring: continuous pulse ox and blood pressure Approach: midline Injection technique: LOR air  Needle:  Needle type: Tuohy  Needle gauge: 17 G Needle length: 9 cm and 9 Needle insertion depth: 5 cm cm Catheter type: closed end flexible Catheter size: 19 Gauge Catheter at skin depth: 10 cm Test dose: negative and Other  Assessment Sensory level: T8 Events: blood not aspirated, injection not painful, no injection resistance, negative IV test and no paresthesia  Additional Notes Reason for block:procedure for pain

## 2011-12-09 NOTE — Progress Notes (Signed)
Norma Fuentes is a 26 y.o. G2P0010 at [redacted]w[redacted]d by ultrasound admitted for induction of labor due to Post dates.   Subjective: Pt c/o pain with attempt to place foley bulb.  S/p Nubain with no relief of sx.  Objective: BP 115/72  Pulse 90  Temp 98 F (36.7 C) (Oral)  Resp 20  Ht 5\' 2"  (1.575 m)  Wt 85.73 kg (189 lb)  BMI 34.57 kg/m2  LMP 03/01/2011      FHT:  FHR: 140 bpm, variability: moderate,  accelerations:  Present,  decelerations:  Absent UC:   irregular, every 2-3 minutes SVE:   Dilation: 2.5 Effacement (%): 60 Station: -2 Exam by:: Fuentes Foley bulb placed Labs: Lab Results  Component Value Date   WBC 12.4* 12/09/2011   HGB 12.2 12/09/2011   HCT 37.0 12/09/2011   MCV 90.5 12/09/2011   PLT 285 12/09/2011    Assessment / Plan: IOL for postdates.  S/p cytotec x 1.  Now with foley bulb.  Cervical change noted.  Will begin low dose pitocin  Labor: Progressing normally Preeclampsia:  no signs or symptoms of toxicity Fetal Wellbeing:  Category I Pain Control:  p.o. pain meds at present.  Pt desires epidural I/D:  n/a Anticipated MOD:  NSVD  Fuentes, Norma Minter 12/09/2011, 4:36 PM

## 2011-12-09 NOTE — Progress Notes (Signed)
I saw and examined patient along with student and agree with above note.   FRAZIER,NATALIE 12/09/2011 3:46 PM

## 2011-12-09 NOTE — Progress Notes (Signed)
   Subjective: Norma Fuentes is a 26 y.o. G2P0010 at [redacted]w[redacted]d by ultrasound admitted for induction of labor due to Post dates. Due date Estimated Date of Delivery: 12/01/11. She has received one dose of Cytotec at 0952 this morning. She is now starting to get uncomfortable and is tearful, having trouble walking without pain. Breathing through her contractions.   Objective: BP 125/77  Pulse 88  Temp 98 F (36.7 C) (Oral)  Resp 20  Ht 5\' 2"  (1.575 m)  Wt 85.73 kg (189 lb)  BMI 34.57 kg/m2  LMP 03/01/2011      FHT:  FHR: 135 bpm, variability: moderate,  accelerations:  Present,  decelerations:  Absent UC:   irregular, every 2-6 minutes SVE:   Dilation: 1.5 Effacement (%): Thick Station: -3 Exam by:: [employer], CNM  Labs: Lab Results  Component Value Date   WBC 12.4* 12/09/2011   HGB 12.2 12/09/2011   HCT 37.0 12/09/2011   MCV 90.5 12/09/2011   PLT 285 12/09/2011    Assessment / Plan: Induction of labor due to postterm,  progressing well on pitocin Foley bulb insertion into cervix was attempted by Bascom Levels, CNM - unsuccessful Plan to watch contraction pattern for an hour and decide at that time to wether to insert 2nd dose of cytotec  Labor: Progressing normally Preeclampsia:  none Fetal Wellbeing:  Category I Pain Control:  Labor support without medications I/D:  n/a Anticipated MOD:  NSVD  Koa Palla 12/09/2011, 2:20 PM

## 2011-12-09 NOTE — Progress Notes (Signed)
   Norma Fuentes is a 26 y.o. G2P0010 at [redacted]w[redacted]d  admitted for induction of labor due to Low amniotic fluid..  Subjective:  Comfortable with epidural Objective: BP 110/58  Pulse 80  Temp 97.8 F (36.6 C) (Oral)  Resp 20  Ht 5\' 2"  (1.575 m)  Wt 85.73 kg (189 lb)  BMI 34.57 kg/m2  SpO2 99%  LMP 03/01/2011    FHT:  FHR: 140 bpm, variability: moderate,  accelerations:  Present,  decelerations:  Absent UC:   regular, every 2 minutes SVE:   Dilation: 2.5 Effacement (%): 70 Station: -2 Exam by:: Cresenzo Dishmon,CNM Foley fell out, but likely was cervical Foley replace through inner os and inflated with 60cc H20 Labs: Lab Results  Component Value Date   WBC 12.4* 12/09/2011   HGB 12.2 12/09/2011   HCT 37.0 12/09/2011   MCV 90.5 12/09/2011   PLT 285 12/09/2011   Pitocin at 2 mu/min Assessment / Plan: IOL for postdates, ripening phase  Labor: Progressing normally Fetal Wellbeing:  Category I Pain Control:  Epidural Anticipated MOD:  NSVD  CRESENZO-DISHMAN,Norma Fuentes 12/09/2011, 7:03 PM

## 2011-12-10 LAB — TYPE AND SCREEN: ABO/RH(D): O POS

## 2011-12-10 NOTE — Progress Notes (Signed)
Norma Fuentes is a 26 y.o. G2P0010 at [redacted]w[redacted]d admitted for IOL due to postdates  Subjective: Patient becoming uncomfortable.  Feeling contractions, complains of back pain.  No other complaints at this time.  Objective: BP 115/66  Pulse 83  Temp 98 F (36.7 C) (Oral)  Resp 18  Ht 5\' 2"  (1.575 m)  Wt 85.73 kg (189 lb)  BMI 34.57 kg/m2  SpO2 99%  LMP 03/01/2011 I/O last 3 completed shifts: In: -  Out: 3400 [Urine:3400]    FHT:  FHR: 135 bpm, variability: moderate,  accelerations:  Present,  decelerations:  Absent UC:   regular, every 3-4 minutes, MVUs 100 est.- inadequate. SVE:   Dilation: 7 Effacement (%): 80 Station: -1 Exam by:: [employer]  Labs: Lab Results  Component Value Date   WBC 12.4* 12/09/2011   HGB 12.2 12/09/2011   HCT 37.0 12/09/2011   MCV 90.5 12/09/2011   PLT 285 12/09/2011    Assessment / Plan: 26 y.o. G2P0010 at [redacted]w[redacted]d admitted for IOL due to postdates  Labor: restarted pit, continue to monitor for adequate contractions Preeclampsia:  none Fetal Wellbeing:  Category I Pain Control:  Epidural I/D:  GBS+, PenG Anticipated MOD:  NSVD  [name] 12/10/2011, 8:19 PM

## 2011-12-10 NOTE — Progress Notes (Signed)
Norma Fuentes is a 26 y.o. G2P0010 at [redacted]w[redacted]d admitted for IOL due to postdates  Subjective: Foley bulb out overnight, ROM at 5:30 am. Patient is doing well and comfortable. Have some early decelerations.  Objective: BP 98/50  Pulse 91  Temp 98.1 F (36.7 C) (Axillary)  Resp 18  Ht 5\' 2"  (1.575 m)  Wt 85.73 kg (189 lb)  BMI 34.57 kg/m2  SpO2 99%  LMP 03/01/2011 I/O last 3 completed shifts: In: -  Out: 2800 [Urine:2800]    FHT:  FHR: 130 bpm, variability: moderate,  accelerations:  Present,  decelerations:  Present early UC:   regular, every 2-3 minutes SVE:   Dilation: 8 Effacement (%): 90 Station: 0 Exam by:: Kuneff  Labs: Lab Results  Component Value Date   WBC 12.4* 12/09/2011   HGB 12.2 12/09/2011   HCT 37.0 12/09/2011   MCV 90.5 12/09/2011   PLT 285 12/09/2011    Assessment / Plan: 26 y.o. G2P0010 at [redacted]w[redacted]d admitted for IOL due to postdates  Labor: on pitocin, continue to monitor q2-3hrs Preeclampsia:  n/a Fetal Wellbeing:  Category I Pain Control:  Epidural I/D:  GBS+, Pen G Anticipated MOD:  NSVD  Kuneff, Renee 12/10/2011, 9:45 AM   is a 26 y.o. G2P0010 at [redacted]w[redacted]d by ultrasound admitted for induction of labor due to Post dates. Due date .  Subjective:   Objective: BP 98/50  Pulse 91  Temp 98.1 F (36.7 C) (Axillary)  Resp 18  Ht 5\' 2"  (1.575 m)  Wt 85.73 kg (189 lb)  BMI 34.57 kg/m2  SpO2 99%  LMP 03/01/2011 I/O last 3 completed shifts: In: -  Out: 2800 [Urine:2800]    FHT:  FHR: 130 bpm, variability: moderate,  accelerations:  Present,  decelerations:  Present early UC:   regular, every 2  minutes SVE:   Dilation: 8 Effacement (%): 90 Station: 0 Exam by:: Kuneff  Labs: Lab Results  Component Value Date   WBC 12.4* 12/09/2011   HGB 12.2 12/09/2011   HCT 37.0 12/09/2011   MCV 90.5 12/09/2011   PLT 285 12/09/2011    Assessment / Plan: Induction of labor due to postterm,  progressing well on  pitocin  Labor: Progressing normally Preeclampsia:  labs stable and none Fetal Wellbeing:  Category I Pain Control:  Epidural I/D:  n/a Anticipated MOD:  NSVD  Kuneff, Renee 12/10/2011, 9:45 AM

## 2011-12-10 NOTE — Progress Notes (Signed)
NAKYIAH KUCK is a 26 y.o. G2P0010 at [redacted]w[redacted]d admitted for IOL due to postdates  Subjective: Foley bulb out overnight, ROM  Objective: BP 98/65  Pulse 92  Temp 98.7 F (37.1 C) (Oral)  Resp 18  Ht 5\' 2"  (1.575 m)  Wt 85.73 kg (189 lb)  BMI 34.57 kg/m2  SpO2 99%  LMP 03/01/2011   Total I/O In: -  Out: 2800 [Urine:2800]  FHT:  FHR: 130 bpm, variability: moderate,  accelerations:  Present,  decelerations:  Absent UC:   regular, every 2-3 minutes SVE:   Dilation: 4.5 Effacement (%): 70 Station: -2 Exam by:: B.Cagna,RN  Labs: Lab Results  Component Value Date   WBC 12.4* 12/09/2011   HGB 12.2 12/09/2011   HCT 37.0 12/09/2011   MCV 90.5 12/09/2011   PLT 285 12/09/2011    Assessment / Plan: 26 y.o. G2P0010 at [redacted]w[redacted]d admitted for IOL due to postdates  Labor: on pitocin, continue to monitor q2-3hrs Preeclampsia:  n/a Fetal Wellbeing:  Category I Pain Control:  Epidural I/D:  GBS+, Pen G Anticipated MOD:  NSVD  [name] 12/10/2011, 6:28 AM

## 2011-12-10 NOTE — Progress Notes (Signed)
Norma Fuentes is a 26 y.o. G2P0010 at [redacted]w[redacted]d admitted for IOL for post dates  Subjective:   Objective: BP 98/49  Pulse 83  Temp 98.3 F (36.8 C) (Oral)  Resp 16  Ht 5\' 2"  (1.575 m)  Wt 85.73 kg (189 lb)  BMI 34.57 kg/m2  SpO2 99%  LMP 03/01/2011   Total I/O In: -  Out: 2000 [Urine:2000]  FHT:  FHR: 135 bpm, variability: moderate,  accelerations:  Present,  decelerations:  Absent UC:   regular, every 2-3 minutes SVE:   Dilation: 4.5 Effacement (%): 70 Station: -2 Exam by:: B.Cagna,RN  Labs: Lab Results  Component Value Date   WBC 12.4* 12/09/2011   HGB 12.2 12/09/2011   HCT 37.0 12/09/2011   MCV 90.5 12/09/2011   PLT 285 12/09/2011    Assessment / Plan: 26 y.o. G2P0010 at [redacted]w[redacted]d admitted for IOL for post dates  Labor: progressing.  Regular contractions without pit at this point.  Continue.  Recheck q3-4hr and/or with increased pressure. Preeclampsia:  n/a Fetal Wellbeing:  Category I Pain Control:  Epidural I/D:  GBS+, Pen G Anticipated MOD:  NSVD  Norma Fuentes 12/10/2011, 12:25 AM

## 2011-12-10 NOTE — Progress Notes (Signed)
Norma Fuentes is a 26 y.o. G2P0010 at [redacted]w[redacted]d admitted for IOL for postdates.  Subjective: Patient beginning to feel more pelvic pressure.  Objective: BP 116/73  Pulse 89  Temp 98.2 F (36.8 C) (Oral)  Resp 18  Ht 5\' 2"  (1.575 m)  Wt 85.73 kg (189 lb)  BMI 34.57 kg/m2  SpO2 99%  LMP 03/01/2011 I/O last 3 completed shifts: In: -  Out: 3400 [Urine:3400] Total I/O In: -  Out: 600 [Urine:600]  FHT:  FHR: 140 bpm, variability: moderate,  accelerations:  Present,  decelerations:  Absent UC:   regular, every 3 minutes SVE:   Dilation: 9 Effacement (%): 100 Station: 0 Exam by:: Dr. Thad Ranger  Labs: Lab Results  Component Value Date   WBC 12.4* 12/09/2011   HGB 12.2 12/09/2011   HCT 37.0 12/09/2011   MCV 90.5 12/09/2011   PLT 285 12/09/2011    Assessment / Plan: 26 y.o. G2P0010 at [redacted]w[redacted]d admitted for IOL for postdates. - progressing, on pit 18 - continue, recheck q1hr or if patient feels increased pressure  Fetal Wellbeing:  Category I Pain Control:  Epidural I/D:  GBS+, PenG Anticipated MOD:  NSVD  [name] 12/10/2011, 11:09 PM

## 2011-12-10 NOTE — Progress Notes (Signed)
Vinda Wayland is a 26 y.o. G2P0010 at [redacted]w[redacted]d admitted for IOL due to postdates  Subjective: Foley bulb out overnight, ROM at 5:30 am. Patient is doing well and comfortable. Continuing to have some early decelerations. Feeling more pressure.  Objective: BP 113/71  Pulse 87  Temp 98.5 F (36.9 C) (Oral)  Resp 18  Ht 5\' 2"  (1.575 m)  Wt 85.73 kg (189 lb)  BMI 34.57 kg/m2  SpO2 99%  LMP 03/01/2011 I/O last 3 completed shifts: In: -  Out: 2800 [Urine:2800]    FHT:  FHR: 130 bpm, variability: moderate,  accelerations:  Present,  decelerations:  Present early UC:   regular, every 2-3 minutes SVE:   Dilation: 8 Effacement (%): 90 Station: 0 Exam by:: Amandamarie Feggins  Labs: Lab Results  Component Value Date   WBC 12.4* 12/09/2011   HGB 12.2 12/09/2011   HCT 37.0 12/09/2011   MCV 90.5 12/09/2011   PLT 285 12/09/2011    Assessment / Plan: 26 y.o. G2P0010 at [redacted]w[redacted]d admitted for IOL due to postdates IUPC placed. Patient tolerated well.  Labor: on pitocin, continue to monitor q2-3hrs, possible cervical swelling beginning Preeclampsia:  n/a Fetal Wellbeing:  Category I Pain Control:  Epidural I/D:  GBS+, Pen G Anticipated MOD:  NSVD  Cathleen Yagi 12/10/2011, 11:25 AM

## 2011-12-10 NOTE — Progress Notes (Signed)
Patient ID: Norma Fuentes, female   DOB: 02-Jul-1985, 26 y.o.   MRN: [id number] Norma Fuentes is a 26 y.o. G2P0010 at [redacted]w[redacted]d admitted for IOL due to postdates  Subjective: Foley bulb out overnight, ROM at 5:30 am. Patient is doing well and comfortable. Continuing to have some early decelerations. Feeling more pressure.  Objective: BP 120/65  Pulse 84  Temp 98 F (36.7 C) (Oral)  Resp 20  Ht 5\' 2"  (1.575 m)  Wt 85.73 kg (189 lb)  BMI 34.57 kg/m2  SpO2 99%  LMP 03/01/2011 I/O last 3 completed shifts: In: -  Out: 2800 [Urine:2800]    FHT:  FHR: 130 bpm, variability: moderate,  accelerations:  Present,  decelerations:  Present early UC:   regular, every 2-3 minutes SVE:   Dilation: 8 Effacement (%): 90 Station: 0 Exam by:: Kuneff Cervical Exam: 6 by Thad Ranger Labs: Lab Results  Component Value Date   WBC 12.4* 12/09/2011   HGB 12.2 12/09/2011   HCT 37.0 12/09/2011   MCV 90.5 12/09/2011   PLT 285 12/09/2011    Assessment / Plan: 26 y.o. G2P0010 at [redacted]w[redacted]d admitted for IOL due to postdates IUPC placed. Patient tolerated well. Scalp electrode applied by Thad Ranger. MD  Labor: on pitocin, continue to monitor q2-3hrs, possible cervical swelling beginning Preeclampsia:  n/a Fetal Wellbeing:  Category I Pain Control:  Epidural I/D:  GBS+, Pen G Anticipated MOD:  NSVD  Kuneff, Renee 12/10/2011, 1:42 PM

## 2011-12-10 NOTE — Progress Notes (Signed)
Patient ID: Norma Fuentes, female   DOB: 01-31-1986, 26 y.o.   MRN: [id number] Norma Fuentes is a 26 y.o. G2P0010 at [redacted]w[redacted]d admitted for IOL due to postdates  Subjective: Patient feeling more pressure, She has been turning in different positions to attempt to get baby rotated. She is now sitting straight up.  Objective: BP 115/85  Pulse 84  Temp 98 F (36.7 C) (Oral)  Resp 20  Ht 5\' 2"  (1.575 m)  Wt 85.73 kg (189 lb)  BMI 34.57 kg/m2  SpO2 99%  LMP 03/01/2011 I/O last 3 completed shifts: In: -  Out: 2800 [Urine:2800] Total I/O In: -  Out: 600 [Urine:600]  FHT:  FHR: 130 bpm, variability: moderate,  accelerations:  Present,  decelerations:  Absent UC:   regular, every 1-2 minutes SVE:   Dilation: 6 Effacement (%): 90 Station: -2 Exam by:: [employer]: Lab Results  Component Value Date   WBC 12.4* 12/09/2011   HGB 12.2 12/09/2011   HCT 37.0 12/09/2011   MCV 90.5 12/09/2011   PLT 285 12/09/2011    Assessment / Plan: 26 y.o. G2P0010 at [redacted]w[redacted]d admitted for IOL due to postdates IUPC and scalp electrode present  Labor: on pitocin, continue to monitor q2-3hrs Preeclampsia:  n/a Fetal Wellbeing:  Category I Pain Control:  Epidural I/D:  GBS+, Pen G Anticipated MOD:  NSVD  Kuneff, Renee 12/10/2011, 5:33 PM

## 2011-12-10 NOTE — Progress Notes (Signed)
Patient ID: Norma Fuentes, female   DOB: 08/21/1985, 26 y.o.   MRN: [id number]  S:  Pt is blocked and comfortable. She is tearful and frustrated at slow progress.  O:   Filed Vitals:   12/10/11 1301  BP: 120/65  Pulse: 84  Temp:   Resp:    Cervical exam:  5-6/90/-2, significant caput FSE placed successfully. IUPC in place MVUs:  185  FHTs:  130, moderate variability, accels present, variables, with occasional slow return to baseline. Overall reassuring strip.  A/P Continue to increase pitocin.   Napoleon Form, MD

## 2011-12-11 ENCOUNTER — Encounter (HOSPITAL_COMMUNITY): Payer: Self-pay

## 2011-12-11 ENCOUNTER — Other Ambulatory Visit: Payer: Private Health Insurance - Indemnity

## 2011-12-11 DIAGNOSIS — O480 Post-term pregnancy: Secondary | ICD-10-CM

## 2011-12-11 DIAGNOSIS — O411090 Infection of amniotic sac and membranes, unspecified, unspecified trimester, not applicable or unspecified: Secondary | ICD-10-CM

## 2011-12-11 LAB — CREATININE, SERUM: Creatinine, Ser: 1.15 mg/dL — ABNORMAL HIGH (ref 0.50–1.10)

## 2011-12-11 LAB — CBC
MCH: 30.4 pg (ref 26.0–34.0)
MCV: 89.1 fL (ref 78.0–100.0)
Platelets: 233 10*3/uL (ref 150–400)
RBC: 3.39 MIL/uL — ABNORMAL LOW (ref 3.87–5.11)
RDW: 15.9 % — ABNORMAL HIGH (ref 11.5–15.5)
WBC: 25.5 10*3/uL — ABNORMAL HIGH (ref 4.0–10.5)

## 2011-12-11 MED ORDER — SENNOSIDES-DOCUSATE SODIUM 8.6-50 MG PO TABS
2.0000 | ORAL_TABLET | Freq: Every day | ORAL | Status: DC
  Administered 2011-12-11 – 2011-12-12 (×2): 2 via ORAL

## 2011-12-11 MED ORDER — SODIUM CHLORIDE 0.9 % IV SOLN
2.0000 g | Freq: Four times a day (QID) | INTRAVENOUS | Status: DC
  Administered 2011-12-11 (×2): 2 g via INTRAVENOUS
  Filled 2011-12-11 (×3): qty 2000

## 2011-12-11 MED ORDER — GENTAMICIN SULFATE 40 MG/ML IJ SOLN
160.0000 mg | Freq: Three times a day (TID) | INTRAVENOUS | Status: DC
  Filled 2011-12-11: qty 4

## 2011-12-11 MED ORDER — IBUPROFEN 600 MG PO TABS
600.0000 mg | ORAL_TABLET | Freq: Four times a day (QID) | ORAL | Status: DC
  Administered 2011-12-11 – 2011-12-13 (×8): 600 mg via ORAL
  Filled 2011-12-11 (×8): qty 1

## 2011-12-11 MED ORDER — ONDANSETRON HCL 4 MG PO TABS: 4.0000 mg | ORAL_TABLET | ORAL | Status: DC | PRN

## 2011-12-11 MED ORDER — SIMETHICONE 80 MG PO CHEW: 80.0000 mg | CHEWABLE_TABLET | ORAL | Status: DC | PRN

## 2011-12-11 MED ORDER — BENZOCAINE-MENTHOL 20-0.5 % EX AERO
1.0000 "application " | INHALATION_SPRAY | CUTANEOUS | Status: DC | PRN
  Administered 2011-12-11: 1 via TOPICAL
  Filled 2011-12-11: qty 56

## 2011-12-11 MED ORDER — OXYCODONE-ACETAMINOPHEN 5-325 MG PO TABS
1.0000 | ORAL_TABLET | ORAL | Status: DC | PRN
  Administered 2011-12-11 – 2011-12-12 (×7): 1 via ORAL
  Filled 2011-12-11 (×7): qty 1

## 2011-12-11 MED ORDER — DIBUCAINE 1 % RE OINT: 1.0000 "application " | TOPICAL_OINTMENT | RECTAL | Status: DC | PRN

## 2011-12-11 MED ORDER — ONDANSETRON HCL 4 MG/2ML IJ SOLN: 4.0000 mg | INTRAMUSCULAR | Status: DC | PRN

## 2011-12-11 MED ORDER — ESTRADIOL 0.1 MG/GM VA CREA
TOPICAL_CREAM | VAGINAL | Status: AC
  Filled 2011-12-11: qty 42.5

## 2011-12-11 MED ORDER — DOCUSATE SODIUM 100 MG PO CAPS
100.0000 mg | ORAL_CAPSULE | Freq: Every day | ORAL | Status: DC
  Administered 2011-12-11 – 2011-12-13 (×3): 100 mg via ORAL
  Filled 2011-12-11 (×3): qty 1

## 2011-12-11 MED ORDER — WITCH HAZEL-GLYCERIN EX PADS
1.0000 "application " | MEDICATED_PAD | CUTANEOUS | Status: DC | PRN
  Administered 2011-12-11: 1 via TOPICAL

## 2011-12-11 MED ORDER — DIPHENHYDRAMINE HCL 25 MG PO CAPS: 25.0000 mg | ORAL_CAPSULE | Freq: Four times a day (QID) | ORAL | Status: DC | PRN

## 2011-12-11 MED ORDER — ACETAMINOPHEN 650 MG RE SUPP: 650.0000 mg | Freq: Once | RECTAL | Status: DC

## 2011-12-11 MED ORDER — PRENATAL MULTIVITAMIN CH
1.0000 | ORAL_TABLET | Freq: Every day | ORAL | Status: DC
  Administered 2011-12-11 – 2011-12-13 (×3): 1 via ORAL
  Filled 2011-12-11 (×3): qty 1

## 2011-12-11 MED ORDER — ACETAMINOPHEN 10 MG/ML IV SOLN
1000.0000 mg | Freq: Once | INTRAVENOUS | Status: DC
  Filled 2011-12-11: qty 100

## 2011-12-11 MED ORDER — TETANUS-DIPHTH-ACELL PERTUSSIS 5-2.5-18.5 LF-MCG/0.5 IM SUSP: 0.5000 mL | Freq: Once | INTRAMUSCULAR | Status: DC

## 2011-12-11 MED ORDER — LANOLIN HYDROUS EX OINT: TOPICAL_OINTMENT | CUTANEOUS | Status: DC | PRN

## 2011-12-11 MED ORDER — GENTAMICIN SULFATE 40 MG/ML IJ SOLN
170.0000 mg | Freq: Once | INTRAVENOUS | Status: AC
  Administered 2011-12-11: 170 mg via INTRAVENOUS
  Filled 2011-12-11: qty 4.25

## 2011-12-11 MED ORDER — ZOLPIDEM TARTRATE 5 MG PO TABS: 5.0000 mg | ORAL_TABLET | Freq: Every evening | ORAL | Status: DC | PRN

## 2011-12-11 NOTE — Consult Note (Signed)
Neonatology Note:  Attendance at Code Apgar:   Our team responded to a Code Apgar call to room # 169 following NSVD, due to infant with resp distress. 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 ROM occurred 23 hours PTD and the fluid was clear. The mother had a temp of 101.8 degrees 1.5 hours prior to delivery. She had already received several doses of Pen G and was then started on Ampicillin and Gentamicin just before delivery. At delivery, the baby was slow to breathe. The OB nursing staff in attendance gave vigorous stimulation and a Code Apgar was called. Our team arrived at 2 1/2 minutes of life, at which time the baby was crying, but had very coarse breath sounds . We placed a pulse oximeter, which showed normal O2 saturations for age. We bulb suctioned, then DeLee suctioned for some thick mucous. PE was remarkable for crepitus over the left clavicle and retractions. There was some improvement in aeration, but the baby was still somewhat labored, so we did CPT with dramatic improvement. His color was pink, perfusion excellent, and the retractions cleared up, with equal, clear breath sounds on auscultation. Ap 4/7/9.  I spoke with the parents in the DR, then transferred the baby to the Pediatrician's care.   Doretha Sou, MD

## 2011-12-11 NOTE — Anesthesia Postprocedure Evaluation (Signed)
Anesthesia Post Note  Patient: Norma Fuentes  Procedure(s) Performed: * No procedures listed *  Anesthesia type: Epidural  Patient location: Mother/Baby  Post pain: Pain level controlled  Post assessment: Post-op Vital signs reviewed  Last Vitals:  Filed Vitals:   12/11/11 1140  BP: 107/68  Pulse: 100  Temp:   Resp:     Post vital signs: Reviewed  Level of consciousness: awake  Complications: No apparent anesthesia complications

## 2011-12-11 NOTE — Progress Notes (Signed)
Patient ID: Norma Fuentes, female   DOB: 12-Sep-1985, 26 y.o.   MRN: [id number]  Called to patient room due to significant pressure, wanting to push.  Pt is blocked but feeling pain in back/side and a lot of pressure. Began to push at approximately 1:00AM with only minimal progress in 2 hours. At 02:30 am patient had a temperature of 101.8.  Ampicillin and gentamycin started. FHTs reassuring.   Dr. Shawnie Pons called to room due to lack of progress in pushing. Pushed for additional 30 minutes. Baby began to have decels into the 80s and 90s. Dr. Shawnie Pons discussed risks and benefits of vacuum delivery with patient and patient consented to vacuum delivery. See Delivery note for details.  9 lb, 15.6 oz female infant delivered with mild shoulder dystocia and 4th degree laceration. Dr. Shawnie Pons present for  delivery and repair.  Napoleon Form, MD

## 2011-12-11 NOTE — Progress Notes (Signed)
Post Partum Day #0 Subjective: tolerating PO. Patient doing ok today. She is very sore. She had attempted to get up with assistance and became dizzy. She has not voided on her own yet, but foley is out. No BM or flatus. Removing vaginal packing now.  Objective: Blood pressure 100/62, pulse 83, temperature 98.3 F (36.8 C), temperature source Oral, resp. rate 18, height 5\' 2"  (1.575 m), weight 85.73 kg (189 lb), last menstrual period 03/01/2011, SpO2 98.00%, unknown if currently breastfeeding.  Physical Exam:  General: Alert, oriented and cooperative. Patient looks uncomfortable. Lochia: Will monitor closely now that packing has been removed.   - Patient tolerated removal of packing well. Packing was moderately soaked with brown/red blood towards the beginning and saturated with older clotted blood at the end of the packing. Packing was removed without difficulty.  Uterine Fundus: firm Incision: 4th degree laceration not visualized. Labia extremely edematous. Ice packing at perineum.   DVT Evaluation: No evidence of DVT seen on physical exam. Negative Homan's sign. No cords or calf tenderness.   Basename 12/09/11 0735  HGB 12.2  HCT 37.0    Assessment/Plan: - Vacuum assisted vaginal delivery PPD #0-1. - Febrile Mother at delivery: Chorioamnionitis - Amp/Gent Dc'd this morning - Viable 9#15oz, female infant - 4th degree laceration - Vaginal Packing - Removed, patient tolerated well - Dizziness with ambulation: CBC ordered - Monitor closely for vaginal bleeding. - Encourage fluids PO   LOS: 2 days   Chasmine Lender 12/11/2011, 11:31 AM

## 2011-12-11 NOTE — Progress Notes (Signed)
I saw and examined patient and reviewed fetal and maternal monitors. Agree with above note. Napoleon Form, MD

## 2011-12-11 NOTE — Progress Notes (Signed)
I have reviewed resident note, patient's chart, and fetal/maternal monitoring. Agree with above.  Napoleon Form, MD

## 2011-12-11 NOTE — Progress Notes (Signed)
Patient ID: Norma Fuentes, female   DOB: [birth year], 26 y.o.   MRN: [id number]   S:  Pt feeling a lot of pressure like she needs to have BM. Back pain.  O: Filed Vitals:   12/11/11 0001  BP: 106/49  Pulse: 93  Temp:   Resp:   Temp 98.2 at 22:51  FHTs:  130s, moderate variability, accels present, no decels.  MVUs:  Around 125  Cervix:  9/100/+2 Cervix felt almost all the way around, except posterior. Presenting part of head very low.   A/P: Will continue to labor down for now and redose epidural for comfort.  Napoleon Form, MD

## 2011-12-11 NOTE — Progress Notes (Signed)
ANTIBIOTIC CONSULT NOTE - INITIAL  Pharmacy Consult for gentamicin Indication: maternal fever, R/O chorioamniotisis  No Known Allergies  Patient Measurements: Height: 5\' 2"  (157.5 cm) Weight: 189 lb (85.73 kg) IBW/kg (Calculated) : 50.1  Adjusted Body Weight: 62kg  Vital Signs: Temp: 101.8 F (38.8 C) (10/18 0233) Temp src: Axillary (10/18 0233) BP: 112/58 mmHg (10/18 0233) Pulse Rate: 86  (10/18 0233) Intake/Output from previous day: 10/17 0701 - 10/18 0700 In: -  Out: 1500 [Urine:1500] Intake/Output from this shift: Total I/O In: -  Out: 900 [Urine:900]  Labs:  [facility] 12/09/11 0735  WBC 12.4*  HGB 12.2  PLT 285  LABCREA --  CREATININE --   CrCl is unknown because no creatinine reading has been taken. Will use an estimated CrCl for pregnancy/age/normal urine output = 149ml/min  Microbiology: No results found for this or any previous visit (from the past 720 hour(s)).  Medical History: Past Medical History  Diagnosis Date  . Headache   . Herpes     Medications:  Scheduled:    . ampicillin (OMNIPEN) IV  2 g Intravenous Q6H  . oxytocin 40 units in LR 1000 mL  500 mL Intravenous Once  . oxytocin 40 units in LR 1000 mL  62.5 mL/hr Intravenous Once  . valACYclovir  500 mg Oral BID  . DISCONTD: pencillin G potassium IV  2.5 Million Units Intravenous Q4H   Infusions:    . fentaNYL 2.5 mcg/ml w/bupivacaine 1/10% in NS epidural infusion ( total) 14 mL/hr (12/10/11 2141)  . lactated ringers 125 mL/hr at 12/11/11 0015  . oxytocin 40 units in LR 1000 mL 18 milli-units/min (12/11/11 0220)   Assessment: 28yr term pregnancy with maternal fever, previously on PCN GBS tx x 5 doses.  Coverage needed for probable infection of pelvic origin, now with ampicillin & gentamicin  Goal of Therapy:  Plan gentamicin regimen to provide serum peak l;evels 6-33mcg/ml and trough <67mcg/ml.  Will need to verify estimated CrCl with serum creatinine if tx  continued.  Plan:  1.  Gentamicin loading dose = 170mg  x 1, then 2. Start gentamicin maintenance regimen 160mg  q8h 3. Get SCr in morning    Rachel Rison E 12/11/2011,2:41 AM

## 2011-12-12 LAB — CBC
HCT: 27.4 % — ABNORMAL LOW (ref 36.0–46.0)
Hemoglobin: 9.3 g/dL — ABNORMAL LOW (ref 12.0–15.0)
MCHC: 33.9 g/dL (ref 30.0–36.0)
RDW: 16 % — ABNORMAL HIGH (ref 11.5–15.5)
WBC: 18.7 10*3/uL — ABNORMAL HIGH (ref 4.0–10.5)

## 2011-12-12 NOTE — Progress Notes (Signed)
Post Partum Day 1 Subjective: no complaints, up ad lib, voiding and tolerating PO  Objective: Blood pressure 91/57, pulse 86, temperature 98.6 F (37 C), temperature source Oral, resp. rate 18, height 5\' 2"  (1.575 m), weight 85.73 kg (189 lb), last menstrual period 03/01/2011, SpO2 96.00%, unknown if currently breastfeeding.  Physical Exam:  General: alert, cooperative and no distress Lochia: appropriate, scant Uterine Fundus: firm, -2 Incision: N/A DVT Evaluation: No evidence of DVT seen on physical exam. Negative Homan's sign. No cords or calf tenderness. No significant calf/ankle edema.   Basename 12/12/11 0520 12/11/11 1222  HGB 9.3* 10.3*  HCT 27.4* 30.2*    Assessment/Plan: Plan for discharge tomorrow, Breastfeeding and Contraception Mini-pill   LOS: 3 days   LEFTWICH-KIRBY, Trudie Cervantes 12/12/2011, 7:37 AM

## 2011-12-13 MED ORDER — IBUPROFEN 600 MG PO TABS: 600.0000 mg | ORAL_TABLET | Freq: Four times a day (QID) | ORAL | Status: DC

## 2011-12-13 MED ORDER — OXYCODONE-ACETAMINOPHEN 5-325 MG PO TABS: 1.0000 | ORAL_TABLET | Freq: Four times a day (QID) | ORAL | Status: DC | PRN

## 2011-12-13 MED ORDER — NORETHINDRONE 0.35 MG PO TABS: 1.0000 | ORAL_TABLET | Freq: Every day | ORAL | Status: DC

## 2011-12-13 MED ORDER — DSS 100 MG PO CAPS: 100.0000 mg | ORAL_CAPSULE | Freq: Two times a day (BID) | ORAL | Status: DC

## 2011-12-13 MED ORDER — BENZOCAINE-MENTHOL 20-0.5 % EX AERO: 1.0000 "application " | INHALATION_SPRAY | CUTANEOUS | Status: DC | PRN

## 2011-12-13 NOTE — Discharge Summary (Signed)
Obstetric Discharge Summary Reason for Admission: induction of labor and for post-dates Prenatal Procedures: NST and ultrasound Intrapartum Procedures: vacuum, GBS prophylaxis and treatment of chorioamnionitis Postpartum Procedures: none Complications-Operative and Postpartum: 4th degree perineal laceration  Patient is a 26 y.o. [id number] presenting for IOL at [redacted]w[redacted]d for post-dates. She underwent cervical ripening with cytotec and foley bulb and was then started on pitocin. She also received penicillin for GBS prophylaxis. She had a long latent phase of labor but progressed normally once she reached 6 cm dilation. An IUPC and fetal scalp electrode were placed. The patient developed a fever (101.8) during labor and was treated with ampicillin and gentamycin for chorioamnionitis. Once completely dilated, the patient pushed for 3 hours. Due to non-reassuring fetal heart tones, vacuum-assisted delivery was performed (please see delivery note for further details). The delivery was complicated by shoulder dystocia and 4th degree perineal laceration.  The patient's postpartum course was unremarkable. On the day of discharge, her pain was controlled, she was voiding without difficulty, tolerating PO, passing flatus and ambulating.   Hemoglobin  Date Value Range Status  12/12/2011 9.3* 12.0 - 15.0 g/dL Final     HCT  Date Value Range Status  12/12/2011 27.4* 36.0 - 46.0 % Final    Physical Exam:  General: alert, cooperative and no distress Lochia: appropriate Uterine Fundus: firm Incision: n/a DVT Evaluation: No evidence of DVT seen on physical exam.  Discharge Diagnoses: Post-date pregnancy, delivered; vacuum assisted vaginal delivery; anemia; 4th degree perineal laceration  Discharge Information: Date: 12/13/2011 Activity: pelvic rest Diet: routine and high fiber to keep stools regular and soft Medications: PNV, Ibuprofen, Colace, Percocet and Micronor Condition: stable Instructions: refer to  practice specific booklet Discharge to: home   Follow-up Information    Follow up with [facility] [employer]. In 4 weeks. (4 to 6 weeks for postpartum visit)    Contact information:   [address] [place] [address] [phone]         Newborn Data: Live born female  Birth Weight: 9 lb 15 oz (4508 g) APGAR: 4, 7  Home with mother.   LOS:  5 days Napoleon Form 12/13/2011, 9:20 AM

## 2011-12-22 ENCOUNTER — Telehealth: Payer: Self-pay

## 2011-12-22 ENCOUNTER — Other Ambulatory Visit: Payer: Self-pay | Admitting: Family Medicine

## 2011-12-22 MED ORDER — OXYCODONE-ACETAMINOPHEN 5-325 MG PO TABS: 1.0000 | ORAL_TABLET | Freq: Four times a day (QID) | ORAL | Status: DC | PRN

## 2012-01-19 ENCOUNTER — Encounter: Payer: Self-pay | Admitting: Family Medicine

## 2012-01-19 ENCOUNTER — Ambulatory Visit (INDEPENDENT_AMBULATORY_CARE_PROVIDER_SITE_OTHER): Payer: Private Health Insurance - Indemnity | Admitting: Family Medicine

## 2012-01-19 MED ORDER — NORGESTIMATE-ETH ESTRADIOL 0.25-35 MG-MCG PO TABS: 1.0000 | ORAL_TABLET | Freq: Every day | ORAL | Status: DC

## 2012-01-19 NOTE — Progress Notes (Signed)
  Subjective:     Norma Fuentes is a 26 y.o. female who presents for a postpartum visit. She is 6 weeks postpartum following a vacuum assisted vaginal delivery. I have fully reviewed the prenatal and intrapartum course. The delivery was at 41 gestational weeks. Outcome: vacuum, outlet. Anesthesia: epidural. Postpartum course has been WNL. Baby's course has been uneventful. Baby is feeding by bottle - Enfamil with Iron. Bleeding no bleeding. Bowel function is normal. Bladder function is normal. Patient is not sexually active. Contraception method is OCP (estrogen/progesterone). Postpartum depression screening: negative.  The following portions of the patient's history were reviewed and updated as appropriate: allergies, current medications, past family history, past medical history, past social history, past surgical history and problem list.  Review of Systems A comprehensive review of systems was negative.   Objective:    BP 120/76  Pulse 90  Ht 5\' 1"  (1.549 m)  Wt 168 lb (76.204 kg)  BMI 31.74 kg/m2  Breastfeeding? No  General:  alert, cooperative and appears stated age           Abdomen: soft, non-tender; bowel sounds normal; no masses,  no organomegaly   Vulva:  normal  Vagina: normal vagina  Cervix:  multiparous appearance and no lesions  Corpus: normal size, contour, position, consistency, mobility, non-tender  Adnexa:  normal adnexa  Rectal Exam: Not performed.        Assessment:    6 wk postpartum exam. Pap smear not done at today's visit.   Plan:    1. Contraception: OCP (estrogen/progesterone) 2. Needs pap 03/2013 3. Follow up in: 1 year or as needed.

## 2012-01-19 NOTE — Progress Notes (Signed)
Routine post partum check.

## 2012-01-26 ENCOUNTER — Ambulatory Visit: Payer: Private Health Insurance - Indemnity | Admitting: Family Medicine

## 2012-12-29 ENCOUNTER — Other Ambulatory Visit: Payer: Self-pay

## 2013-02-27 IMAGING — US US OB COMP LESS 14 WK
1 series · 13 of 28 positions shown · non-contrast
Comparison: none

[Series 1: us ob comp less 14 wks · 13 of 29 slices shown]
[im 2/29]
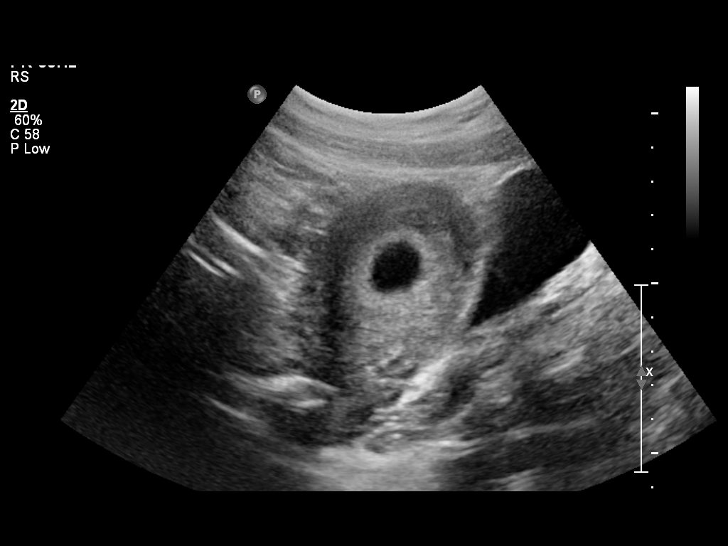
[im 4/29]
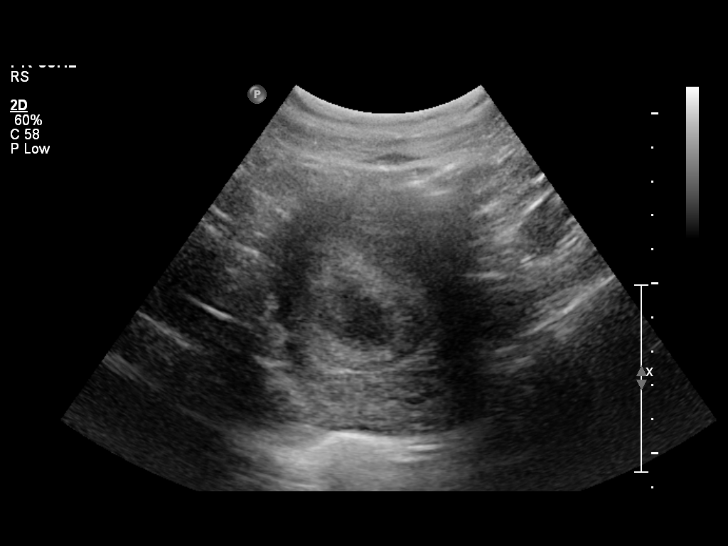
[im 6/29]
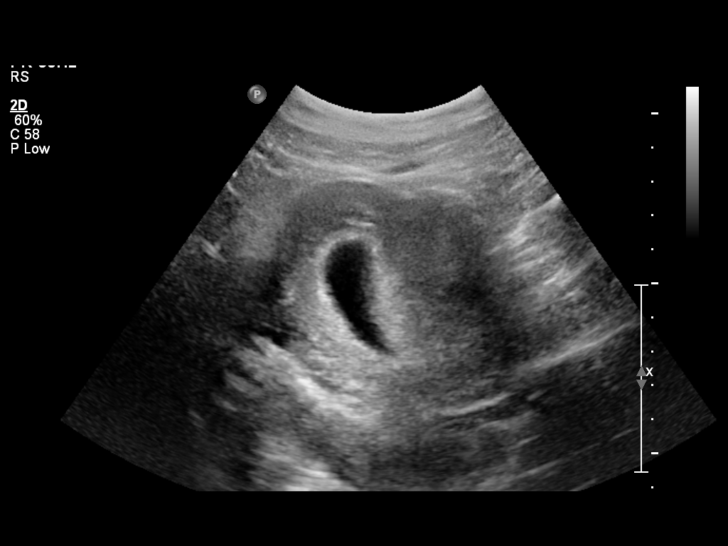
[im 8/29]
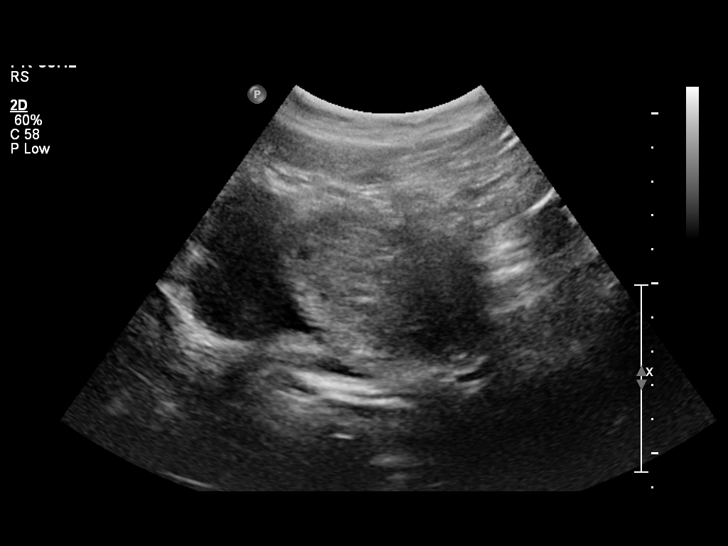
[im 10/29]
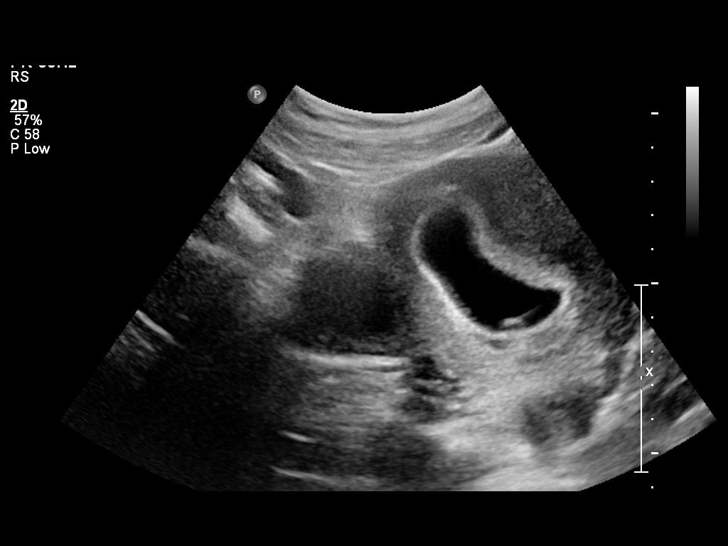
[im 12/29]
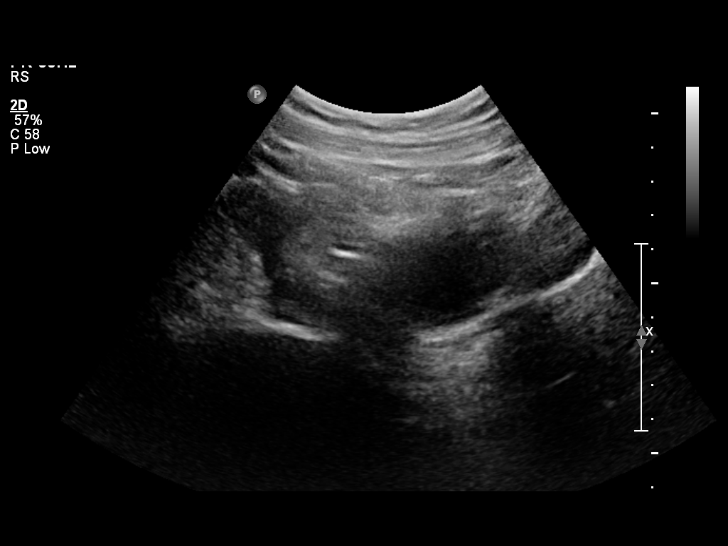
[im 15/29]
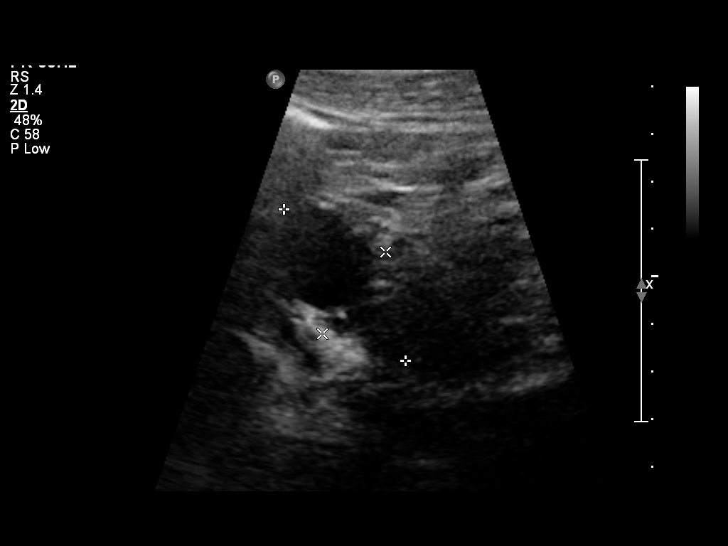
[im 17/29]
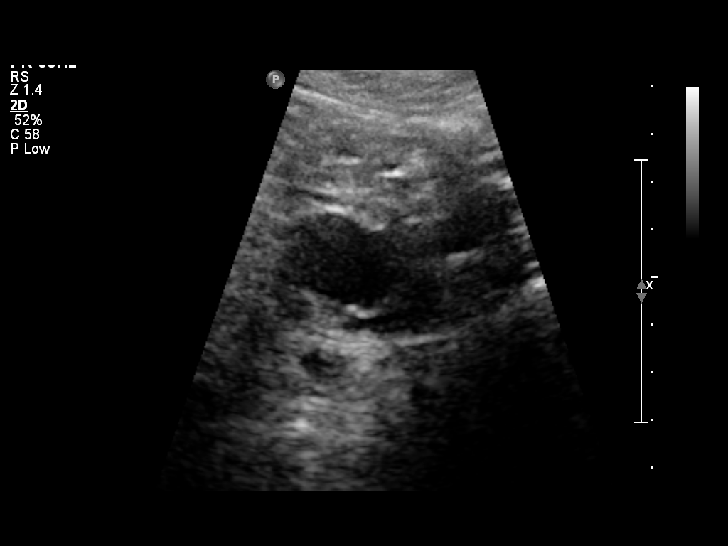
[im 19/29]
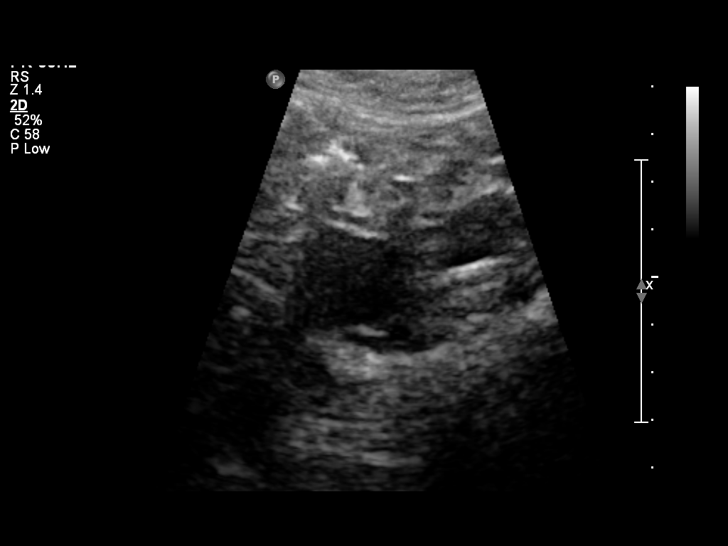
[im 21/29]
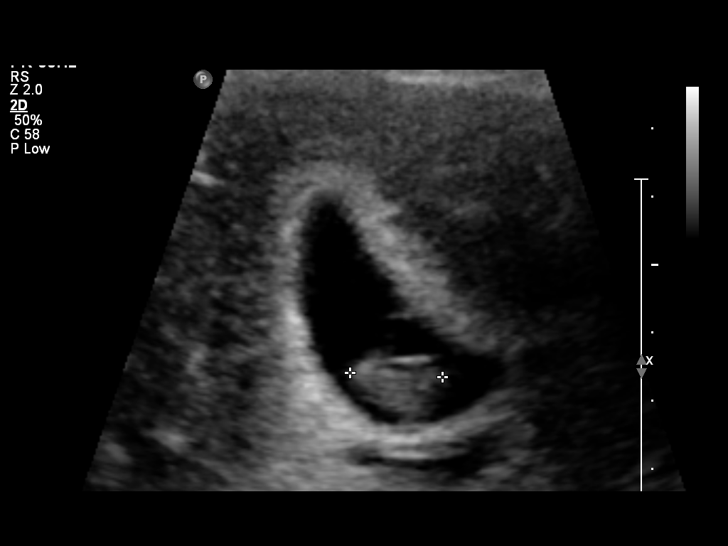
[im 23/29]
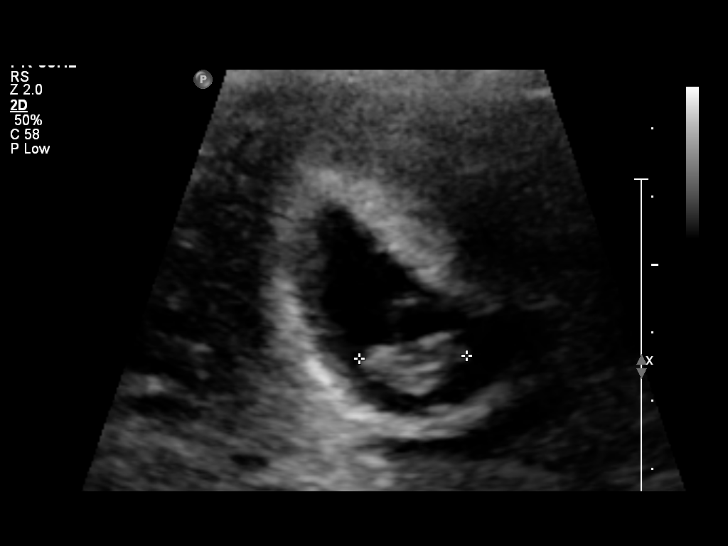
[im 25/29]
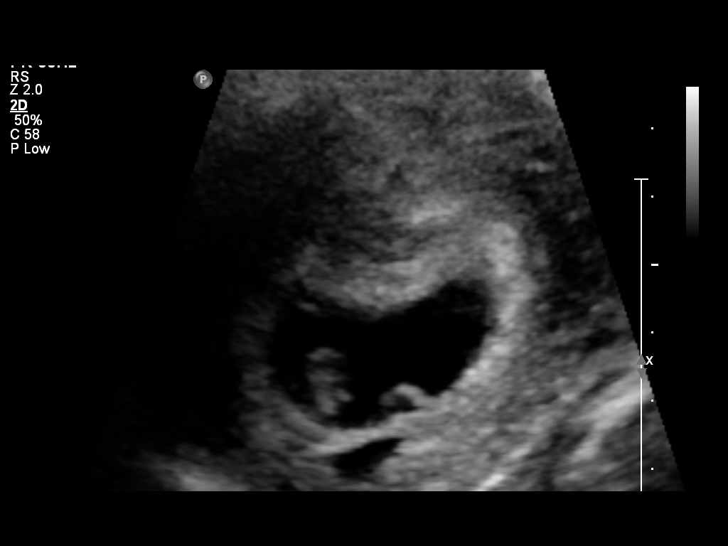
[im 27/29]
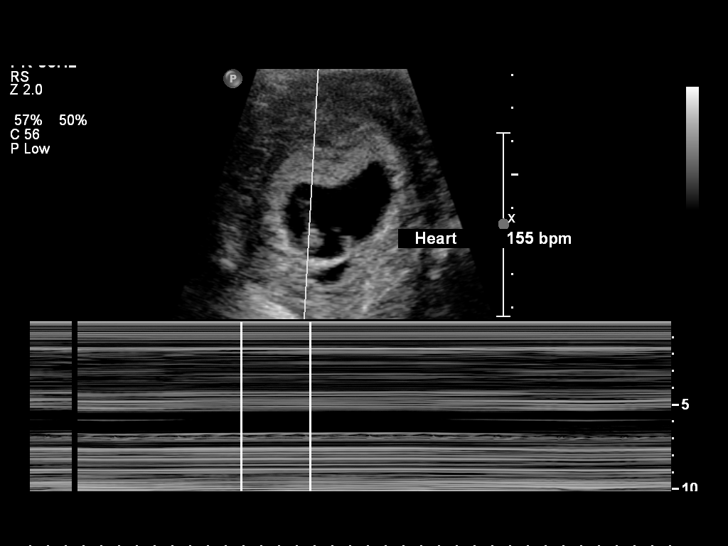

[13 of 28 positions shown; findings below may reference images not displayed]

OBSTETRICS REPORT
                      (Signed Final 04/20/2011 [DATE])

 Order#:         [id number]
Procedures

 US OB COMP LESS 14 WKS                                76801.0
Indications

 Unsure of LMP;  Establish Gestational [AGE]
 Poor obstetrical history (prev SAB)
Fetal Evaluation

 Preg. Location:    Intrauterine
 Gest. Sac:         Intrauterine, small
                    subchorionic bleed
 [place]olk Sac:          Visualized
 Fetal Pole:        Visualized
 Fetal Heart Rate:  155                         bpm
 Cardiac Activity:  Observed
Biometry

 CRL:     14.8  mm    G. Age:   7w 6d                  EDD:   12/01/11
Gestational Age

 LMP:           7w 1d        Date:   03/01/11                 EDD:   12/06/11
 Best:          7w 1d     Det. By:   LMP  (03/01/11)          EDD:   12/06/11
Cervix Uterus Adnexa

 Cervix:       Closed.
 Uterus:       Small subchorionic hemmorhage noted.
 Cul De Sac:   No free fluid seen.

 Left Ovary:   Within normal limits.
 Right Ovary:  Within normal limits. Small corpus luteum noted.
 Adnexa:     No abnormality visualized.
Impression

 Single living IUP.  US EGA is concordant with LMP.
 Small subchorionic hemorrhage noted.
 No adnexal mass or free fluid identified.
 questions or concerns.

## 2013-05-18 NOTE — Telephone Encounter (Signed)
ERROR

## 2013-12-25 ENCOUNTER — Encounter: Payer: Self-pay | Admitting: Family Medicine

## 2016-06-15 ENCOUNTER — Ambulatory Visit (INDEPENDENT_AMBULATORY_CARE_PROVIDER_SITE_OTHER): Payer: BLUE CROSS/BLUE SHIELD | Admitting: Physician Assistant

## 2016-06-15 VITALS — BP 121/80 | HR 87 | Temp 98.5°F | Resp 18 | Ht 61.0 in | Wt 140.6 lb

## 2016-06-15 DIAGNOSIS — R51 Headache: Secondary | ICD-10-CM

## 2016-06-15 DIAGNOSIS — H53149 Visual discomfort, unspecified: Secondary | ICD-10-CM

## 2016-06-15 DIAGNOSIS — H543 Unqualified visual loss, both eyes: Secondary | ICD-10-CM

## 2016-06-15 DIAGNOSIS — H5713 Ocular pain, bilateral: Secondary | ICD-10-CM | POA: Diagnosis not present

## 2016-06-15 DIAGNOSIS — R519 Headache, unspecified: Secondary | ICD-10-CM

## 2016-06-15 NOTE — Progress Notes (Signed)
Norma Fuentes  MRN: [id number] DOB: [birth year]  PCP: Willodean Rosenthal, MD  Subjective:  Pt is a 31 year old female PMH genital herpes and cystic acne who presents to clinic for decreased vision.    She reports rapid vision loss over the past 3 weeks. First noticed a "dimness/darkness". She originally thought it was related to sinuses. Went to Urgent care in [place] 5 days ago. C/o Pain behind eyes. Treated with augmentin and flonase for sinusitis. No improvement.    Today she c/o She cannot see "I can see you but it is very blurry". " "I can see like a heart beat" in upper visual field x 1 week. "she can see something in her right eye, like an eyelash in my eye". Right eye she sees something wavy. In the morning she is at her best. In the evening when she is more tired it is bad. She used to have 20/20 vision. Never wore glasses. She is endorsing photophobia - wears sunglasses inside her house.    c/o pain behind eyes, massive headache and eyes are burning. Eyes are sore to touch.  Headache is located on both temples. Is constant. Wakes her up from sleep at night. When she is laying down the headache goes from the middle of her eyes and radiates out. Pain of headache is 5/10 during the day, 8/10 pain at night.  She has been taking Tylenol every 4 hours for pain.  h/o migraines, this is significantly worse.  Denies thunderclap, nausea, vomiting, double vision, paresthesias.   Review of Systems  Constitutional: Negative for chills and fever.  Eyes: Positive for photophobia, pain and visual disturbance. Negative for discharge and redness.  Gastrointestinal: Negative for nausea and vomiting.  Neurological: Positive for headaches. Negative for dizziness, seizures, syncope, speech difficulty and weakness.  Psychiatric/Behavioral: Positive for sleep disturbance (due to headache). Negative for agitation, confusion and decreased concentration.    Patient Active Problem List   Diagnosis Date Noted  . History of herpes genitalis 10/01/2011  . Cystic acne, extensive 04/23/2011    Current Outpatient Prescriptions on File Prior to Visit  Medication Sig Dispense Refill  . calcium carbonate (TUMS - DOSED IN MG ELEMENTAL CALCIUM) 500 MG chewable tablet Chew 1 tablet by mouth 2 (two) times daily as needed. For heartburn    . norgestimate-ethinyl estradiol (ORTHO-CYCLEN,SPRINTEC,PREVIFEM) 0.25-35 MG-MCG tablet Take 1 tablet by mouth daily. (Patient not taking: Reported on 06/15/2016) 1 Package 11   No current facility-administered medications on file prior to visit.     No Known Allergies   Objective:  BP 121/80   Pulse 87   Temp 98.5 F (36.9 C) (Oral)   Resp 18   Ht  (1.549 m)   Wt 140 lb 9.6 oz (63.8 kg)   LMP 06/10/2016   SpO2 98%   BMI 26.57 kg/m   Physical Exam  Constitutional: She is oriented to person, place, and time and well-developed, well-nourished, and in no distress. No distress.  Eyes: Right eye visual fields normal and left eye visual fields normal. Conjunctivae and EOM are normal. Pupils are equal, round, and reactive to light.  Cardiovascular: Normal rate, regular rhythm and normal heart sounds.   Neurological: She is alert and oriented to person, place, and time. GCS score is 15.  Skin: Skin is warm and dry.  Psychiatric: Mood, memory, affect and judgment normal.  Vitals reviewed.   Visual Acuity Screening   Right eye Left eye Both eyes  Without correction:  20/200 20/100 20/100  With correction:       Assessment and Plan :  1. Decreased vision in both eyes 2. Photophobia 3. Pain of both eyes 4. Nonintractable headache, unspecified chronicity pattern, unspecified headache type - Ambulatory referral to Ophthalmology - CT HEAD WO CONTRAST; Future - Significant decreased visual acuity over a 3 week time period with accompanying headache. No significant abnormal findings on exam.  Will refer for stat imaging and evaluation by  opthamology. Will contact pt with results.   Marco Collie, PA-C  Primary Care at [facility] 06/15/2016 2:49 PM

## 2016-06-15 NOTE — Patient Instructions (Addendum)
  Dr Dione Booze eye md  06/16/16 at 1030am   [address] [place] [address]  Ct scan of the head [facility] 06/16/16 1230pm  [address] [place] will contact you with results  IF you received an x-ray today, you will receive an invoice from [facility]. Please contact [facility] at [phone] with questions or concerns regarding your invoice.   IF you received labwork today, you will receive an invoice from [facility]. Please contact [facility] at [phone] with questions or concerns regarding your invoice.   Our billing staff will not be able to assist you with questions regarding bills from these companies.  You will be contacted with the lab results as soon as they are available. The fastest way to get your results is to activate your My Chart account. Instructions are located on the last page of this paperwork. If you have not heard from [place] regarding the results in 2 weeks, please contact this office.

## 2016-06-16 ENCOUNTER — Other Ambulatory Visit: Payer: Private Health Insurance - Indemnity

## 2016-06-19 ENCOUNTER — Ambulatory Visit
Admission: RE | Admit: 2016-06-19 | Discharge: 2016-06-19 | Disposition: A | Discharge status: Home / Self Care | Payer: BLUE CROSS/BLUE SHIELD | Source: Ambulatory Visit | Attending: Physician Assistant | Admitting: Physician Assistant

## 2016-06-19 DIAGNOSIS — H543 Unqualified visual loss, both eyes: Secondary | ICD-10-CM

## 2016-06-19 NOTE — Progress Notes (Signed)
Spoke with pt on phone, discussed CT results. Her symptoms worsened. She was referred to [facility] by Dr. Dione Booze. Started on oral steroids. Has f/u appt at [facility].

## 2019-03-14 ENCOUNTER — Encounter: Payer: Self-pay | Admitting: Genetic Counselor

## 2019-03-14 ENCOUNTER — Telehealth: Payer: Self-pay | Admitting: Genetic Counselor

## 2019-03-14 NOTE — Telephone Encounter (Signed)
Reached out to Ms. Barriere to set up a genetic counseling visit with her per request of her mother. Ms. Cherney had called and left a voicemail to [facility] around two weeks ago and had not received a response.   I will see her virtually via MyChart video visit next Tuesday (03/21/19) at 2pm. A text with a link to join the visit will be sent to her at the time of her appointment.

## 2019-03-21 ENCOUNTER — Inpatient Hospital Stay: Payer: BC Managed Care – PPO | Attending: Genetic Counselor | Admitting: Genetic Counselor

## 2019-03-21 ENCOUNTER — Encounter: Payer: Self-pay | Admitting: Genetic Counselor

## 2019-03-21 DIAGNOSIS — Z8049 Family history of malignant neoplasm of other genital organs: Secondary | ICD-10-CM | POA: Diagnosis not present

## 2019-03-21 DIAGNOSIS — Z800 Family history of malignant neoplasm of digestive organs: Secondary | ICD-10-CM | POA: Diagnosis not present

## 2019-03-21 DIAGNOSIS — Z803 Family history of malignant neoplasm of breast: Secondary | ICD-10-CM

## 2019-03-21 DIAGNOSIS — Z808 Family history of malignant neoplasm of other organs or systems: Secondary | ICD-10-CM | POA: Diagnosis not present

## 2019-03-21 DIAGNOSIS — Z1379 Encounter for other screening for genetic and chromosomal anomalies: Secondary | ICD-10-CM | POA: Diagnosis not present

## 2019-03-21 NOTE — Progress Notes (Signed)
REFERRING PROVIDER: No referring provider defined for this encounter.  PRIMARY PROVIDER:  Patient, No Pcp Per  PRIMARY REASON FOR VISIT:  1. Family history of Lynch syndrome   2. Family history of uterine cancer   3. Family history of colon cancer   4. Family history of stomach cancer   5. Family history of brain cancer   6. Family history of breast cancer   7. Family history of skin cancer      I connected with Norma Fuentes on 03/21/2019 at 2:00 pm EDT by MyChart video conference and verified that I am speaking with the correct person using two identifiers.   Patient location: home Provider location: [facility] office  HISTORY OF PRESENT ILLNESS:   Norma Fuentes, a 34 y.o. female, was seen for a [facility] cancer genetics consultation due to a family history of Lynch syndrome.  Norma Fuentes presents to clinic today to discuss the possibility of a hereditary predisposition to cancer, genetic testing, and to further clarify her future cancer risks, as well as potential cancer risks for family members.   Norma Fuentes is a 34 y.o. female with no personal history of cancer.    CANCER HISTORY:  Oncology History   No history exists.     RISK FACTORS:  Menarche was at age 66.  First live birth at age 74.  OCP use for less than 1 year.  Ovaries intact: yes.  Hysterectomy: no.  Menopausal status: premenopausal.  HRT use: 0 years. Colonoscopy: no. Mammogram within the last year: no. Number of breast biopsies: 0. Any excessive radiation exposure in the past: no  Past Medical History:  Diagnosis Date  . Family history of brain cancer   . Family history of breast cancer   . Family history of colon cancer   . Family history of Lynch syndrome   . Family history of skin cancer   . Family history of stomach cancer   . Family history of uterine cancer   . Headache(784.0)   . Herpes     No past surgical history on file.  Social History   Socioeconomic History  . Marital status:  Married    Spouse name: Not on file  . Number of children: Not on file  . Years of education: Not on file  . Highest education level: Not on file  Occupational History  . Not on file  Tobacco Use  . Smoking status: Current Every Day Smoker    Years: 5.00    Types: Cigarettes    Last attempt to quit: 04/06/2011    Years since quitting: 7.9  . Smokeless tobacco: Never Used  Substance and Sexual Activity  . Alcohol use: No  . Drug use: No  . Sexual activity: Yes    Birth control/protection: Pill  Other Topics Concern  . Not on file  Social History Narrative  . Not on file   Social Determinants of Health   Financial Resource Strain:   . Difficulty of Paying Living Expenses: Not on file  Food Insecurity:   . Worried About Charity fundraiser in the Last Year: Not on file  . Ran Out of Food in the Last Year: Not on file  Transportation Needs:   . Lack of Transportation (Medical): Not on file  . Lack of Transportation (Non-Medical): Not on file  Physical Activity:   . Days of Exercise per Week: Not on file  . Minutes of Exercise per Session: Not on file  Stress:   .  Feeling of Stress : Not on file  Social Connections:   . Frequency of Communication with Friends and Family: Not on file  . Frequency of Social Gatherings with Friends and Family: Not on file  . Attends Religious Services: Not on file  . Active Member of Clubs or Organizations: Not on file  . Attends Archivist Meetings: Not on file  . Marital Status: Not on file     FAMILY HISTORY:  We obtained a detailed, 4-generation family history.  Significant diagnoses are listed below: Family History  Problem Relation Age of Onset  . Endometriosis Maternal Aunt   . Endometrial cancer Maternal Aunt 30  . Diabetes Paternal Grandmother   . Skin cancer Paternal Grandmother        dx. 26s  . Endometrial cancer Mother 4       Lynch syndrome, MSH6 positive  . Basal cell carcinoma Brother   . Stomach cancer  Maternal Grandfather 42  . Colon cancer Maternal Grandfather 48  . Brain cancer Maternal Aunt 42       Cancerous hypothalamus  . Colon cancer Cousin 48       Lynch syndrome  . Breast cancer Cousin 56  . Breast cancer Cousin 80       not genetic   Norma Fuentes has one son, Norma Fuentes, who is 7 and has not had cancer. She has one sister who is 109, and she has one brother who passed away at age 32 due to anaphylactic shock. Her brother had a history of basal cell carcinoma.   Norma Fuentes's mother, Norma Fuentes, was diagnosed with endometrial cancer at age 53 and had positive genetic testing for Lynch syndrome. She has three maternal aunts and one maternal uncle. One of her aunts died from brain cancer at age 89. Another aunt had uterine cancer at age 51 and is currently living in her 52s. Her maternal grandmother died at age 58, and her maternal grandfather died at age 80 and had a history of stomach cancer at age 85 and colon cancer at age 11. Norma Fuentes also has a cousin who had colon cancer at age 45 and was subsequently diagnosed with Lynch syndrome. Another cousin had breast cancer at age 84, and another cousin had breast cancer at age 79 that was "not genetic".  Norma Fuentes father is currently living at age 46 and does not have a history of cancer, although he does not go to the doctor often. She has one paternal aunt who is 62, and does not have any biological first cousins. Her paternal grandmother died in her early 9s and had a history of skin cancer diagnosed in her 28s, although Norma Fuentes is not sure what type of skin cancer it was. Her paternal grandfather died in his 64s and did not have cancer.  Norma Fuentes is aware of previous family history of genetic testing for hereditary cancer risks in her mother, who had a mutation in the MSH6 gene called c.3261dup (p.[id number]*5). Her maternal ancestors are of [place], [place], [place], and Native American descent, and paternal ancestors  are of [place], [place], [place], and [place] descent. There is reported Ashkenazi Jewish ancestry - Ancestry DNA testing revealed 2-3% Ashkenazi Jewish ancestry on her maternal side of the family. There is no known consanguinity.  GENETIC COUNSELING ASSESSMENT: Norma Fuentes is a 34 y.o. female with a family history of Lynch syndrome, a hereditary cancer syndrome. We, therefore, discussed and recommended the following at today's visit.  DISCUSSION:  We discussed that Norma Fuentes has a 50% (1 in 2) chance to also have the MSH6 variant that was discovered in her mother. Pathogenic variants (mutations) in the MSH6 gene result in a genetic condition called Lynch syndrome. Lynch syndrome causes an increased risk for colon and endometrial cancer, in addition to other cancer types. Individuals with MSH6 mutations may opt for increased cancer screening and risk-reduction strategies for associated cancer risks per the NCCN guidelines.  We discussed that testing is beneficial for multiple reasons including knowing about potential cancer risks and identifying screening and risk-reduction options that may be appropriate.  We reviewed the characteristics, features and inheritance patterns of hereditary cancer syndromes.  We also discussed genetic testing, including the appropriate family members to test, the process of testing, insurance coverage, genetic discrimination, and turn-around-time for results. We discussed the implications of a negative vs a positive result.    We recommended Norma Fuentes pursue genetic testing for the known familial variant in MSH6, called c.3261dup (p.[id number]*5), through [facility]. Because Norma Fuentes's mother had genetic testing that identified a pathogenic variant through [facility], testing in Norma Fuentes will be free of charge if carried out through the same genetic testing laboratory if completed within 150 days of the original report.    PLAN: After considering the  risks, benefits, and limitations, Norma Fuentes provided informed consent to pursue genetic testing and the saliva sample will be sent to [facility] for analysis of the MSH6 gene. Results should be available within approximately two-three weeks' time, at which point they will be disclosed by telephone to Ms. Quebedeaux, as will any additional recommendations warranted by these results. Ms. Fullenwider will receive a summary of her genetic counseling visit and a copy of her results once available. This information will also be available in Epic.   Ms. Lakins questions were answered to her satisfaction today. Our contact information was provided should additional questions or concerns arise. Thank you for the referral and allowing [place] to share in the care of your patient.   Clint Guy, MS, [facility] Certified Genetic Counselor [email or web address] Phone: [phone]  The patient was seen for a total of 30 minutes in face-to-face genetic counseling.  This patient was discussed with Drs. Magrinat, Lindi Adie and/or Burr Medico who agrees with the above.    _______________________________________________________________________ For Office Staff:  Number of people involved in session: 1 Was an Intern/ student involved with case: no

## 2019-04-19 ENCOUNTER — Encounter: Payer: Self-pay | Admitting: Genetic Counselor

## 2019-04-19 ENCOUNTER — Telehealth: Payer: Self-pay | Admitting: Genetic Counselor

## 2019-04-19 DIAGNOSIS — Z1379 Encounter for other screening for genetic and chromosomal anomalies: Secondary | ICD-10-CM | POA: Insufficient documentation

## 2019-04-19 DIAGNOSIS — Z1509 Genetic susceptibility to other malignant neoplasm: Secondary | ICD-10-CM | POA: Insufficient documentation

## 2019-04-19 NOTE — Telephone Encounter (Signed)
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 This confirms the diagnosis of Lynch syndrome in Norma Fuentes and a hereditary predisposition to cancer. We set up a virtual follow-up appointment for 04/26/19 at 9am to review these results in greater detail.

## 2019-04-26 ENCOUNTER — Ambulatory Visit (HOSPITAL_BASED_OUTPATIENT_CLINIC_OR_DEPARTMENT_OTHER): Payer: BC Managed Care – PPO | Admitting: Genetic Counselor

## 2019-04-26 DIAGNOSIS — Z1379 Encounter for other screening for genetic and chromosomal anomalies: Secondary | ICD-10-CM

## 2019-04-26 DIAGNOSIS — Z1509 Genetic susceptibility to other malignant neoplasm: Secondary | ICD-10-CM

## 2019-04-26 NOTE — Progress Notes (Signed)
REFERRING PROVIDER: No referring provider defined for this encounter.  PRIMARY PROVIDER:  Patient, No Pcp Per  PRIMARY REASON FOR VISIT:  1. MSH6-related Lynch syndrome (HNPCC5)   2. Genetic testing     GENETIC TEST RESULTS   Patient Name: Norma Fuentes Patient Age: 34 y.o. Encounter Date: 04/26/2019  HPI: Norma Fuentes was previously seen in the [place] clinic due to a family of Lynch syndrome and concerns regarding a hereditary predisposition to cancer. Please refer to our prior cancer genetics clinic note for more information regarding Norma Fuentes's medical, social and family histories, and our assessment and recommendations, at the time. Norma Fuentes recent genetic test results were disclosed to her, as were recommendations warranted by these results. These results and recommendations are discussed in more detail below.   FAMILY HISTORY:  We obtained a detailed, 4-generation family history.  Significant diagnoses are listed below: Family History  Problem Relation Age of Onset  . Endometriosis Maternal Aunt   . Endometrial cancer Maternal Aunt 30  . Diabetes Paternal Grandmother   . Skin cancer Paternal Grandmother        dx. 81s  . Endometrial cancer Mother 16       Lynch syndrome, MSH6 positive  . Basal cell carcinoma Brother   . Stomach cancer Maternal Grandfather 42  . Colon cancer Maternal Grandfather 2  . Brain cancer Maternal Aunt 42       Cancerous hypothalamus  . Colon cancer Cousin 48       Lynch syndrome  . Breast cancer Cousin 36  . Breast cancer Cousin 17       not genetic   Norma Fuentes has one son, Norma Fuentes, who is 7 and has not had cancer. She has one sister who is 44, and she has one brother who passed away at age 22 due to anaphylactic shock. Her brother had a history of basal cell carcinoma.   Norma Fuentes's mother, Norma Fuentes, was diagnosed with endometrial cancer at age 43 and had positive genetic testing for Lynch  syndrome. She has three maternal aunts and one maternal uncle. One of her aunts died from brain cancer at age 63. Another aunt had uterine cancer at age 9 and is currently living in her 17s. Her maternal grandmother died at age 18, and her maternal grandfather died at age 7 and had a history of stomach cancer at age 84 and colon cancer at age 22. Norma Fuentes also has a cousin who had colon cancer at age 16 and was subsequently diagnosed with Lynch syndrome. Another cousin had breast cancer at age 59, and another cousin had breast cancer at age 69 that was "not genetic".  Norma Fuentes father is currently living at age 78 and does not have a history of cancer, although he does not go to the doctor often. She has one paternal aunt who is 44, and does not have any biological first cousins. Her paternal grandmother died in her early 30s and had a history of skin cancer diagnosed in her 54s, although Norma Fuentes is not sure what type of skin cancer it was. Her paternal grandfather died in his 7s and did not have cancer.  Norma Fuentes is aware of previous family history of genetic testing for hereditary cancer risks in her mother, who had a mutation in the MSH6 gene called c.3261dup (p.[id number]*5). Her maternal ancestors are of [place], [place], [place], and Native American descent, and paternal ancestors are of [place], [place], [place], and  [place] descent. There is reported Ashkenazi Jewish ancestry - Ancestry DNA testing revealed 2-3% Ashkenazi Jewish ancestry on her maternal side of the family. There is no known consanguinity.  GENETIC TESTING:  At the time of Norma Fuentes visit, we recommended she pursue genetic testing for the specific MSH6 mutation that was identified in the family. The genetic testing reported on 04/17/2019 for analysis of the MSH6 gene, offered by [facility].   A single, heterozygous pathogenic variant called c.3261dup (p.Phe1088Leufs*5) was detected in the MSH6 gene,  confirming the diagnosis of Lynch syndrome.  A copy of the test report will be scanned into Epic for review.     CANCER RISKS & SCREENING RECOMMENDATIONS:  We discussed the implications of Lynch syndrome for Norma Fuentes and discussed who else in the family should have genetic testing. Individuals with Lynch syndrome have an increased risk for colon cancer and endometrial cancer, as well as other types of cancers. The cancer risks associated with the MSH6 gene are currently known to include:  Colon cancer: 10-44%  Endometrial cancer: 16-49%  Ovarian cancer: 1-13%  Renal pelvis and/or ureter: 0.7-5.5%  Bladder: 1.0-8.2%  Gastric: <1-7.9%  Small bowel: <1-4%  Biliary tract: 0.2-<1%  Brain: 0.8-1.8%   Additional screening and risk-reduction options have been identified to be appropriate for individuals with Lynch syndrome to manage these risks.  We recommended Norma Fuentes consider following management guidelines for Lynch syndrome; all of which are outlined below. These can be coordinated by Norma Fuentes GI doctor or@ primary provider.     Surveillance/prevention strategies outlined by the NCCN guidelines (v1.2020) for individuals (both men and women) with MSH6-related Lynch syndrome: 1. High-quality colonoscopy every 1-2 y, beginning at age 69-35 or 13-5 y prior to the earliest colon cancer if it is diagnosed before age 57 y. 2. Consider the use of 600 mg/daily of aspirin for at least 2 y to decrease CRC risk. The decision to use aspirin in Lynch syndrome should be made on an individualized basis including a discussion of dose, benefits, and adverse effects.  3. While there is no clear evidence to support screening for stomach and small bowel cancer, an upper endoscopy can be considered at 3-5 year intervals beginning at age 80 y. However, whether to have this screening is best determined by a gastroenterologist.  4. There is no clear evidence to support screening for urothelial  cancers in Lynch syndrome, although annual urinalysis starting at age 23-35 y may be considered for those with a family history of urothelial cancer.  5. Consider annual physical/neurologic examination starting at age 21-30 y to manage brain cancer risk - no additional screening recommendations have been made.   For women with Lynch syndrome, unlike the effective surveillance plan for colorectal cancer risk, there is no professional agreement regarding management for the increased risk of uterine and ovarian cancer. For endometrial cancer, women are encouraged to be aware of and immediately report dysfunctional or post-menopausal bleeding, which should then be followed up by an endometrial biopsy. In terms of surveillance, transvaginal ultrasound and endometrial biopsies have not been shown to be effective screening tools; however, they may be considered at the clinician's discretion. Importantly, transvaginal ultrasound is not recommended in pre-menopausal women due to variable presentations throughout a normal menstrual cycle. Endometrial biopsy can be considered every 1-2 years, but does not have proven benefit of reducing mortality in women with Lynch syndrome given the typically early presenting symptoms. Finally, while hysterectomy has not been shown to reduce endometrial  cancer mortality, it does reduce incidence, and therefore, can be considered as a risk-reducing option.   Unfortunately, symptoms of early stage ovarian cancer are not as obvious as endometrial cancer; however, women are encouraged to be aware of symptoms, such as pelvic or abdominal pain, bloating, increased abdominal girth, difficulty eating, feeling full from eating quickly, as well as increased urinary frequency and urgency. In terms of surveillance, transvaginal ultrasound examination and serum CA-125 have not been shown to be sufficiently sensitive or specific to support for routine screening. In terms of risk-reducing surgery, the  decision to undergo a bilateral salpingo-oophorectomy (BSO) should be individualized. However, we are available to help women and their providers establish an individualized surveillance plan. It is also important for women to understand the following:  1. Women should seek medical attention if they experience abnormal vaginal bleeding. 2. Some providers may still recommend vaginal ultrasounds, uterine biopsies (for uterine cancer risk) and/or CA-125 analysis (for ovarian cancer risk), even though these have not been shown to be effective. 3. A hysterectomy with removal of the ovaries and fallopian tubes could be considered once childbearing is completed (if planned).  FAMILY MEMBERS: Since we now know the mutation in Norma Fuentes, we can test at-risk relatives to determine whether or not they have inherited the mutation and are at increased risk for cancer.  It is important that all of Norma Fuentes relatives (both men and women) know of the presence of this gene mutation. Site-specific genetic testing can sort out who in the family is at risk and who is not. We will be happy to meet with any of the family members or refer them to a genetic counselor in their local area. To locate genetic counselors in other cities, individuals can visit the website of the [employer] ([employer]ist[email or web address]) and Secretary/administrator for a Social worker by zip code.   Norma Fuentes son, Norma Fuentes, and any future children she may have are at a 50% (1 in 2) chance to have inherited this mutation. However, they are relatively young and this will not be of any consequence to them for several years. We do not test children because there is no risk to them until they are adults. We recommend they have genetic counseling and testing by the time they are in their early 20s.    Children who inherit two mutations in the same Lynch gene, one mutation from each parent, are at-risk of a rare recessive condition called constitutional  mismatch repair deficiency (CMMR-D) syndrome. If family members have this mutation, they may wish to have their partner tested if they are planning on having children.  PLAN: Ms. Sypher will need to be followed as high risk based on her diagnosis of Lynch syndrome.    Ms. Briski does not have a gastroenterologist or gynecologist to perform follow up for the diagnosis of Lynch syndrome.  We discussed the option to be seen at [facility], although Ms. Aydelott prefers to be seen at [facility] where she receives the majority of her medical care. She will plan to follow-up with her providers at [facility] to become established with a GI doctor and a gynecologist within their system.   We strongly encouraged Ms. Abdelrahman to remain in contact with [place] in cancer genetics on an annual basis so we can update Ms. Hamblen's personal and family histories, and inform her of advances in cancer genetics that may be of benefit for the entire family. Ms. Spaid knows she is also welcome to call  with any questions or concerns, at any time.    Clint Guy, [place], [facility] Licensed, Certified Dispensing [email or web address] Phone: [phone]  The patient was seen for a total of 25 minutes in face-to-face genetic counseling.

## 2020-03-02 ENCOUNTER — Emergency Department (HOSPITAL_COMMUNITY)
Admission: EM | Admit: 2020-03-02 | Discharge: 2020-03-02 | Disposition: A | Discharge status: Home / Self Care | Payer: BC Managed Care – PPO

## 2020-03-02 ENCOUNTER — Other Ambulatory Visit: Payer: Self-pay

## 2020-03-02 NOTE — ED Notes (Signed)
Pt stated she was leaving when called for triage.  Stated she would wait to go to the orthopedic.

## 2020-03-06 ENCOUNTER — Encounter: Payer: Self-pay | Admitting: Orthopedic Surgery

## 2020-03-06 ENCOUNTER — Other Ambulatory Visit: Payer: Self-pay | Admitting: Orthopedic Surgery

## 2020-03-06 NOTE — H&P (Signed)
NAME: Norma Fuentes MRN:   [id number] DOB:   07-01-1985     HISTORY AND PHYSICAL  CHIEF COMPLAINT:  Right ankle pain  HISTORY:   Norma Mukherjee Martinezis a 35 y.o. female  with right  Ankle Pain Patient complains of right ankle pain. Onset of the symptoms was a week ago. Inciting event: injured while walking. Current symptoms include: inability to bear weight. Aggravating factors: direct pressure, going up and down stairs, standing and walking . Symptoms have stabilized. Patient has had no prior ankle problems. Previous visits for this problem: none.  Evaluation to date: plain films: abnormal bimallelor fracture.  Treatment to date: avoidance of offending activity, ice, OTC analgesics which are not very effective and prescription NSAIDS which are not very effective.  Plan for right ankle ORIF.  PAST MEDICAL HISTORY:   Past Medical History:  Diagnosis Date  . Family history of brain cancer   . Family history of breast cancer   . Family history of colon cancer   . Family history of Lynch syndrome   . Family history of skin cancer   . Family history of stomach cancer   . Family history of uterine cancer   . Headache(784.0)   . Herpes     PAST SURGICAL HISTORY:  No past surgical history on file.  MEDICATIONS:  (Not in a hospital admission)   ALLERGIES:  No Known Allergies  REVIEW OF SYSTEMS:   Negative except HPI  FAMILY HISTORY:   Family History  Problem Relation Age of Onset  . Endometriosis Maternal Aunt   . Endometrial cancer Maternal Aunt 30  . Diabetes Paternal Grandmother   . Skin cancer Paternal Grandmother        dx. 23s  . Endometrial cancer Mother 25       Lynch syndrome, MSH6 positive  . Basal cell carcinoma Brother   . Stomach cancer Maternal Grandfather 42  . Colon cancer Maternal Grandfather 42  . Brain cancer Maternal Aunt 42       Cancerous hypothalamus  . Colon cancer Cousin 48       Lynch syndrome  . Breast cancer Cousin 73  . Breast cancer Cousin 56        not genetic    SOCIAL HISTORY:   reports that she has been smoking cigarettes. She has smoked for the past 5.00 years. She has never used smokeless tobacco. She reports that she does not drink alcohol and does not use drugs.  PHYSICAL EXAM:  General appearance: alert, cooperative and no distress Neck: no JVD and supple, symmetrical, trachea midline Resp: clear to auscultation bilaterally Cardio: regular rate and rhythm, S1, S2 normal, no murmur, click, rub or gallop GI: soft, non-tender; bowel sounds normal; no masses,  no organomegaly Extremities: Homans sign is negative, no sign of DVT Pulses: 2+ and symmetric Skin: Skin color, texture, turgor normal. No rashes or lesions Incision/Wound:    LABORATORY STUDIES: No results for input(s): WBC, HGB, HCT, PLT in the last 72 hours.  No results for input(s): NA, K, CL, CO2, GLUCOSE, BUN, CREATININE, CALCIUM in the last 72 hours.  STUDIES/RESULTS:  No results found.  ASSESSMENT:  Right ankle bimallelor fracture        Active Problems:   * No active hospital problems. *    PLAN:  Right ankle ORIF   Carlynn Spry 03/06/2020. 9:32 AM

## 2020-03-06 NOTE — H&P (Deleted)
  The note originally documented on this encounter has been moved the the encounter in which it belongs.  

## 2020-03-07 ENCOUNTER — Other Ambulatory Visit: Payer: Self-pay

## 2020-03-07 ENCOUNTER — Encounter
Admission: RE | Admit: 2020-03-07 | Discharge: 2020-03-07 | Disposition: A | Discharge status: Home / Self Care | Payer: Self-pay | Source: Ambulatory Visit | Attending: Orthopedic Surgery | Admitting: Orthopedic Surgery

## 2020-03-07 DIAGNOSIS — Z20822 Contact with and (suspected) exposure to covid-19: Secondary | ICD-10-CM | POA: Insufficient documentation

## 2020-03-07 DIAGNOSIS — Z01812 Encounter for preprocedural laboratory examination: Secondary | ICD-10-CM | POA: Insufficient documentation

## 2020-03-07 LAB — SARS CORONAVIRUS 2 (TAT 6-24 HRS): SARS Coronavirus 2: NEGATIVE

## 2020-03-07 NOTE — Patient Instructions (Addendum)
Your procedure is scheduled on:03-11-20 MONDAY Report to the Registration Desk on the 1st floor of the Medical Mall-Then proceed to the 2nd floor Surgery desk in the Medical Mall To find out your arrival time, please call [phone] between 1PM - 3PM on:03-08-20 FRIDAY  REMEMBER: Instructions that are not followed completely may result in serious medical risk, up to and including death; or upon the discretion of your surgeon and anesthesiologist your surgery may need to be rescheduled.  Do not eat food after midnight the night before surgery.  No gum chewing, lozengers or hard candies.  You may however, drink CLEAR liquids up to 2 hours before you are scheduled to arrive for your surgery. Do not drink anything within 2 hours of your scheduled arrival time.  Clear liquids include: - water  - apple juice without pulp - gatorad - black coffee or tea (Do NOT add milk or creamers to the coffee or tea) Do NOT drink anything that is not on this list.  TAKE THESE MEDICATIONS THE MORNING OF SURGERY WITH A SIP OF WATER: -YOU MAY TAKE HYDROCODONE (NORCO) THE MORNING OF SURGERY IF NEEDED FOR PAIN  One week prior to surgery: Stop Anti-inflammatories (NSAIDS) such as Advil, Aleve, Ibuprofen, Motrin, Naproxen, Naprosyn and Aspirin based products such as Excedrin, Goodys Powder, BC Powder-OK TO TAKE TYLENOL IF NEEDED  Stop ANY OVER THE COUNTER supplements until after surgery-STOP VITAMIN C NOW-YOU MAY RESUME AFTER SURGERY (However, you may continue taking Vitamin D3 and Calcium-Magnesium-Zinc up until the day before surgery.)  No Alcohol for 24 hours before or after surgery.  No Smoking including e-cigarettes for 24 hours prior to surgery.  No chewable tobacco products for at least 6 hours prior to surgery.  No nicotine patches on the day of surgery.  Do not use any "recreational" drugs for at least a week prior to your surgery.  Please be advised that the combination of cocaine and anesthesia  may have negative outcomes, up to and including death. If you test positive for cocaine, your surgery will be cancelled.  On the morning of surgery brush your teeth with toothpaste and water, you may rinse your mouth with mouthwash if you wish. Do not swallow any toothpaste or mouthwash.  Do not wear jewelry, make-up, hairpins, clips or nail polish.  Do not wear lotions, powders, or perfumes.   Do not shave body from the neck down 48 hours prior to surgery just in case you cut yourself which could leave a site for infection.  Also, freshly shaved skin may become irritated if using the CHG soap.  Contact lenses, hearing aids and dentures may not be worn into surgery.  Do not bring valuables to the hospital. [facility] is not responsible for any missing/lost belongings or valuables.   Use CHG wipes as directed on instruction sheet.  Notify your doctor if there is any change in your medical condition (cold, fever, infection).  Wear comfortable clothing (specific to your surgery type) to the hospital.  Plan for stool softeners for home use; pain medications have a tendency to cause constipation. You can also help prevent constipation by eating foods high in fiber such as fruits and vegetables and drinking plenty of fluids as your diet allows.  After surgery, you can help prevent lung complications by doing breathing exercises.  Take deep breaths and cough every 1-2 hours. Your doctor may order a device called an Incentive Spirometer to help you take deep breaths. When coughing or sneezing, hold a  pillow firmly against your incision with both hands. This is called "splinting." Doing this helps protect your incision. It also decreases belly discomfort.  If you are being admitted to the hospital overnight, leave your suitcase in the car. After surgery it may be brought to your room.  If you are being discharged the day of surgery, you will not be allowed to drive home. You will need a  responsible adult (18 years or older) to drive you home and stay with you that night.   If you are taking public transportation, you will need to have a responsible adult (18 years or older) with you. Please confirm with your physician that it is acceptable to use public transportation.   Please call the Pre-admissions Testing Dept. at [phone] if you have any questions about these instructions.  Visitation Policy:  Patients undergoing a surgery or procedure may have one family member or support person with them as long as that person is not COVID-19 positive or experiencing its symptoms.  That person may remain in the waiting area during the procedure.  Inpatient Visitation:    Visiting hours are 7 a.m. to 8 p.m. Patients will be allowed one visitor. The visitor may change daily. The visitor must pass COVID-19 screenings, use hand sanitizer when entering and exiting the patient's room and wear a mask at all times, including in the patient's room. Patients must also wear a mask when staff or their visitor are in the room. Masking is required regardless of vaccination status. Systemwide, no visitors 17 or younger.

## 2020-03-08 ENCOUNTER — Other Ambulatory Visit: Payer: Self-pay

## 2020-03-11 ENCOUNTER — Other Ambulatory Visit: Payer: Self-pay

## 2020-03-11 ENCOUNTER — Ambulatory Visit: Payer: Self-pay | Admitting: Anesthesiology

## 2020-03-11 ENCOUNTER — Ambulatory Visit
Admission: RE | Admit: 2020-03-11 | Discharge: 2020-03-11 | Disposition: A | Discharge status: Home / Self Care | Payer: Self-pay | Attending: Orthopedic Surgery | Admitting: Orthopedic Surgery

## 2020-03-11 ENCOUNTER — Ambulatory Visit: Payer: Self-pay

## 2020-03-11 ENCOUNTER — Encounter
Admission: RE | Disposition: A | Discharge status: Home / Self Care | Payer: Self-pay | Source: Home / Self Care | Attending: Orthopedic Surgery

## 2020-03-11 ENCOUNTER — Encounter: Payer: Self-pay | Admitting: Orthopedic Surgery

## 2020-03-11 DIAGNOSIS — Y9301 Activity, walking, marching and hiking: Secondary | ICD-10-CM | POA: Insufficient documentation

## 2020-03-11 DIAGNOSIS — S82841A Displaced bimalleolar fracture of right lower leg, initial encounter for closed fracture: Secondary | ICD-10-CM | POA: Insufficient documentation

## 2020-03-11 DIAGNOSIS — Z9889 Other specified postprocedural states: Secondary | ICD-10-CM

## 2020-03-11 HISTORY — PX: ORIF ANKLE FRACTURE: SHX5408

## 2020-03-11 LAB — POCT PREGNANCY, URINE: Preg Test, Ur: NEGATIVE

## 2020-03-11 SURGERY — OPEN REDUCTION INTERNAL FIXATION (ORIF) ANKLE FRACTURE
Anesthesia: General | Site: Ankle | Laterality: Right

## 2020-03-11 MED ORDER — ROCURONIUM BROMIDE 100 MG/10ML IV SOLN
INTRAVENOUS | Status: DC | PRN
  Administered 2020-03-11: 50 mg via INTRAVENOUS

## 2020-03-11 MED ORDER — ACETAMINOPHEN 325 MG PO TABS: 325.0000 mg | ORAL_TABLET | Freq: Four times a day (QID) | ORAL | Status: DC | PRN

## 2020-03-11 MED ORDER — CHLORHEXIDINE GLUCONATE 0.12 % MT SOLN: 15.0000 mL | Freq: Once | OROMUCOSAL | Status: AC

## 2020-03-11 MED ORDER — OXYCODONE HCL 5 MG/5ML PO SOLN: 5.0000 mg | Freq: Once | ORAL | Status: DC | PRN

## 2020-03-11 MED ORDER — CEFAZOLIN SODIUM-DEXTROSE 2-4 GM/100ML-% IV SOLN
INTRAVENOUS | Status: AC
  Filled 2020-03-11: qty 100

## 2020-03-11 MED ORDER — BUPIVACAINE HCL (PF) 0.25 % IJ SOLN
INTRAMUSCULAR | Status: DC | PRN
  Administered 2020-03-11 (×2): 20 mL

## 2020-03-11 MED ORDER — FENTANYL CITRATE (PF) 100 MCG/2ML IJ SOLN
INTRAMUSCULAR | Status: AC
  Filled 2020-03-11: qty 2

## 2020-03-11 MED ORDER — HYDROCODONE-ACETAMINOPHEN 5-325 MG PO TABS: 1.0000 | ORAL_TABLET | ORAL | Status: DC | PRN

## 2020-03-11 MED ORDER — LACTATED RINGERS IV SOLN: INTRAVENOUS | Status: DC

## 2020-03-11 MED ORDER — CEFAZOLIN SODIUM-DEXTROSE 2-4 GM/100ML-% IV SOLN
2.0000 g | INTRAVENOUS | Status: AC
  Administered 2020-03-11: 2 g via INTRAVENOUS

## 2020-03-11 MED ORDER — HYDROCODONE-ACETAMINOPHEN 7.5-325 MG PO TABS: 1.0000 | ORAL_TABLET | ORAL | Status: DC | PRN

## 2020-03-11 MED ORDER — MIDAZOLAM HCL 2 MG/2ML IJ SOLN
INTRAMUSCULAR | Status: DC | PRN
  Administered 2020-03-11: 2 mg via INTRAVENOUS

## 2020-03-11 MED ORDER — FENTANYL CITRATE (PF) 100 MCG/2ML IJ SOLN
INTRAMUSCULAR | Status: AC
  Administered 2020-03-11: 50 ug via INTRAVENOUS
  Filled 2020-03-11: qty 2

## 2020-03-11 MED ORDER — ROCURONIUM BROMIDE 10 MG/ML (PF) SYRINGE
PREFILLED_SYRINGE | INTRAVENOUS | Status: AC
  Filled 2020-03-11: qty 10

## 2020-03-11 MED ORDER — ONDANSETRON HCL 4 MG/2ML IJ SOLN
INTRAMUSCULAR | Status: AC
  Filled 2020-03-11: qty 2

## 2020-03-11 MED ORDER — FENTANYL CITRATE (PF) 100 MCG/2ML IJ SOLN
INTRAMUSCULAR | Status: DC | PRN
  Administered 2020-03-11 (×5): 50 ug via INTRAVENOUS

## 2020-03-11 MED ORDER — PROPOFOL 10 MG/ML IV BOLUS
INTRAVENOUS | Status: AC
  Filled 2020-03-11: qty 20

## 2020-03-11 MED ORDER — OXYCODONE HCL 5 MG PO TABS
5.0000 mg | ORAL_TABLET | Freq: Once | ORAL | Status: DC | PRN
Start: 2020-03-11 — End: 2020-03-11

## 2020-03-11 MED ORDER — METOCLOPRAMIDE HCL 5 MG/ML IJ SOLN: 5.0000 mg | Freq: Three times a day (TID) | INTRAMUSCULAR | Status: DC | PRN

## 2020-03-11 MED ORDER — METOCLOPRAMIDE HCL 10 MG PO TABS: 5.0000 mg | ORAL_TABLET | Freq: Three times a day (TID) | ORAL | Status: DC | PRN

## 2020-03-11 MED ORDER — DEXMEDETOMIDINE (PRECEDEX) IN NS 20 MCG/5ML (4 MCG/ML) IV SYRINGE
PREFILLED_SYRINGE | INTRAVENOUS | Status: AC
  Filled 2020-03-11: qty 5

## 2020-03-11 MED ORDER — FENTANYL CITRATE (PF) 100 MCG/2ML IJ SOLN
25.0000 ug | INTRAMUSCULAR | Status: DC | PRN
  Administered 2020-03-11: 50 ug via INTRAVENOUS

## 2020-03-11 MED ORDER — ONDANSETRON HCL 4 MG/2ML IJ SOLN
INTRAMUSCULAR | Status: DC | PRN
  Administered 2020-03-11: 4 mg via INTRAVENOUS

## 2020-03-11 MED ORDER — ACETAMINOPHEN 10 MG/ML IV SOLN
INTRAVENOUS | Status: AC
  Filled 2020-03-11: qty 100

## 2020-03-11 MED ORDER — MIDAZOLAM HCL 2 MG/2ML IJ SOLN
INTRAMUSCULAR | Status: AC
  Filled 2020-03-11: qty 2

## 2020-03-11 MED ORDER — LIDOCAINE HCL (PF) 1 % IJ SOLN
INTRAMUSCULAR | Status: AC
  Filled 2020-03-11: qty 5

## 2020-03-11 MED ORDER — LIDOCAINE HCL (CARDIAC) PF 100 MG/5ML IV SOSY
PREFILLED_SYRINGE | INTRAVENOUS | Status: DC | PRN
  Administered 2020-03-11: 80 mg via INTRAVENOUS

## 2020-03-11 MED ORDER — BUPIVACAINE HCL (PF) 0.5 % IJ SOLN
INTRAMUSCULAR | Status: AC
  Filled 2020-03-11: qty 20

## 2020-03-11 MED ORDER — KETOROLAC TROMETHAMINE 30 MG/ML IJ SOLN
INTRAMUSCULAR | Status: AC
  Filled 2020-03-11: qty 1

## 2020-03-11 MED ORDER — LIDOCAINE HCL (PF) 2 % IJ SOLN
INTRAMUSCULAR | Status: AC
  Filled 2020-03-11: qty 5

## 2020-03-11 MED ORDER — DEXMEDETOMIDINE (PRECEDEX) IN NS 20 MCG/5ML (4 MCG/ML) IV SYRINGE
PREFILLED_SYRINGE | INTRAVENOUS | Status: DC | PRN
  Administered 2020-03-11: 4 ug via INTRAVENOUS
  Administered 2020-03-11: 8 ug via INTRAVENOUS
  Administered 2020-03-11: 4 ug via INTRAVENOUS
  Administered 2020-03-11 (×3): 8 ug via INTRAVENOUS

## 2020-03-11 MED ORDER — DEXAMETHASONE SODIUM PHOSPHATE 10 MG/ML IJ SOLN
INTRAMUSCULAR | Status: AC
  Filled 2020-03-11: qty 1

## 2020-03-11 MED ORDER — PROPOFOL 10 MG/ML IV BOLUS
INTRAVENOUS | Status: DC | PRN
  Administered 2020-03-11: 150 mg via INTRAVENOUS

## 2020-03-11 MED ORDER — MORPHINE SULFATE (PF) 2 MG/ML IV SOLN: 0.5000 mg | INTRAVENOUS | Status: DC | PRN

## 2020-03-11 MED ORDER — ONDANSETRON HCL 4 MG/2ML IJ SOLN: 4.0000 mg | Freq: Four times a day (QID) | INTRAMUSCULAR | Status: DC | PRN

## 2020-03-11 MED ORDER — ACETAMINOPHEN 10 MG/ML IV SOLN
INTRAVENOUS | Status: DC | PRN
  Administered 2020-03-11: 1000 mg via INTRAVENOUS

## 2020-03-11 MED ORDER — KETOROLAC TROMETHAMINE 15 MG/ML IJ SOLN: 15.0000 mg | Freq: Four times a day (QID) | INTRAMUSCULAR | Status: DC

## 2020-03-11 MED ORDER — FAMOTIDINE 20 MG PO TABS: 20.0000 mg | ORAL_TABLET | Freq: Once | ORAL | Status: AC

## 2020-03-11 MED ORDER — FAMOTIDINE 20 MG PO TABS
ORAL_TABLET | ORAL | Status: AC
  Administered 2020-03-11: 20 mg via ORAL
  Filled 2020-03-11: qty 1

## 2020-03-11 MED ORDER — ORAL CARE MOUTH RINSE: 15.0000 mL | Freq: Once | OROMUCOSAL | Status: AC

## 2020-03-11 MED ORDER — ONDANSETRON HCL 4 MG PO TABS: 4.0000 mg | ORAL_TABLET | Freq: Four times a day (QID) | ORAL | Status: DC | PRN

## 2020-03-11 MED ORDER — DEXAMETHASONE SODIUM PHOSPHATE 10 MG/ML IJ SOLN
INTRAMUSCULAR | Status: DC | PRN
  Administered 2020-03-11: 8 mg via INTRAVENOUS

## 2020-03-11 MED ORDER — KETOROLAC TROMETHAMINE 30 MG/ML IJ SOLN
INTRAMUSCULAR | Status: DC | PRN
  Administered 2020-03-11: 30 mg via INTRAVENOUS

## 2020-03-11 MED ORDER — SUGAMMADEX SODIUM 200 MG/2ML IV SOLN
INTRAVENOUS | Status: DC | PRN
  Administered 2020-03-11: 150 mg via INTRAVENOUS

## 2020-03-11 MED ORDER — PROPOFOL 500 MG/50ML IV EMUL
INTRAVENOUS | Status: DC | PRN
  Administered 2020-03-11: 50 ug/kg/min via INTRAVENOUS

## 2020-03-11 MED ORDER — CHLORHEXIDINE GLUCONATE 0.12 % MT SOLN
OROMUCOSAL | Status: AC
  Administered 2020-03-11: 15 mL via OROMUCOSAL
  Filled 2020-03-11: qty 15

## 2020-03-11 SURGICAL SUPPLY — 63 items
APL PRP STRL LF DISP 70% ISPRP (MISCELLANEOUS) ×2
BIT DRILL 2.5 X LONG (BIT) ×1
BIT DRILL 2.5X110 QC LCP DISP (BIT) ×1 IMPLANT
BIT DRILL CANN 2.7X625 NONSTRL (BIT) ×1 IMPLANT
BIT DRILL LCP QC 2X140 (BIT) ×1 IMPLANT
BIT DRILL PL 2.0X85ML MAXO (BIT) ×1 IMPLANT
BIT DRILL QC 3.5X110 (BIT) ×1 IMPLANT
BIT DRILL X LONG 2.5 (BIT) IMPLANT
BNDG COHESIVE 6X5 TAN STRL LF (GAUZE/BANDAGES/DRESSINGS) ×2 IMPLANT
BNDG ELASTIC 6X5.8 VLCR STR LF (GAUZE/BANDAGES/DRESSINGS) ×2 IMPLANT
BNDG ESMARK 6X12 TAN STRL LF (GAUZE/BANDAGES/DRESSINGS) ×2 IMPLANT
BRUSH SCRUB EZ  4% CHG (MISCELLANEOUS) ×4
BRUSH SCRUB EZ 4% CHG (MISCELLANEOUS) ×2 IMPLANT
CANISTER SUCT 1200ML W/VALVE (MISCELLANEOUS) ×2 IMPLANT
CHLORAPREP W/TINT 26 (MISCELLANEOUS) ×4 IMPLANT
COVER WAND RF STERILE (DRAPES) ×2 IMPLANT
CUFF TOURN SGL QUICK 24 (TOURNIQUET CUFF)
CUFF TOURN SGL QUICK 30 (TOURNIQUET CUFF)
CUFF TRNQT CYL 24X4X16.5-23 (TOURNIQUET CUFF) IMPLANT
CUFF TRNQT CYL 30X4X21-28X (TOURNIQUET CUFF) IMPLANT
DRAPE 3/4 80X56 (DRAPES) ×2 IMPLANT
DRAPE FLUOR MINI C-ARM 54X84 (DRAPES) ×2 IMPLANT
DRILL BIT X LONG 2.5 (BIT) ×2
ELECT REM PT RETURN 9FT ADLT (ELECTROSURGICAL) ×2
ELECTRODE REM PT RTRN 9FT ADLT (ELECTROSURGICAL) ×1 IMPLANT
GAUZE XEROFORM 1X8 LF (GAUZE/BANDAGES/DRESSINGS) ×4 IMPLANT
GLOVE INDICATOR 8.0 STRL GRN (GLOVE) ×6 IMPLANT
GLOVE SURG ORTHO LTX SZ8 (GLOVE) ×6 IMPLANT
GOWN STRL REUS W/ TWL LRG LVL3 (GOWN DISPOSABLE) ×1 IMPLANT
GOWN STRL REUS W/ TWL XL LVL3 (GOWN DISPOSABLE) ×1 IMPLANT
GOWN STRL REUS W/TWL LRG LVL3 (GOWN DISPOSABLE) ×2
GOWN STRL REUS W/TWL XL LVL3 (GOWN DISPOSABLE) ×2
GUIDEWARE NON THREAD 1.25X150 (WIRE) ×4
GUIDEWIRE NON THREAD 1.25X150 (WIRE) IMPLANT
K-WIRE 1.6X150 (WIRE) ×2
KIT TURNOVER KIT A (KITS) ×2 IMPLANT
KWIRE 1.6X150 (WIRE) IMPLANT
MANIFOLD NEPTUNE II (INSTRUMENTS) ×2 IMPLANT
NS IRRIG 1000ML POUR BTL (IV SOLUTION) ×2 IMPLANT
PACK EXTREMITY ARMC (MISCELLANEOUS) ×2 IMPLANT
PAD ABD DERMACEA PRESS 5X9 (GAUZE/BANDAGES/DRESSINGS) ×8 IMPLANT
PAD CAST CTTN 4X4 STRL (SOFTGOODS) ×2 IMPLANT
PADDING CAST COTTON 4X4 STRL (SOFTGOODS) ×4
PLATE 4HOLE DISTAL FIB R 2.7 (Plate) ×1 IMPLANT
SCALPEL PROTECTED #10 DISP (BLADE) ×2 IMPLANT
SCALPEL PROTECTED #15 DISP (BLADE) ×2 IMPLANT
SCREW 3.5X18MM (Screw) ×1 IMPLANT
SCREW CORTEX 2.7X14 (Screw) ×2 IMPLANT
SCREW CORTEX LOW PRO 3.5X28 (Screw) ×1 IMPLANT
SCREW LOCK VA ST 2.7X14 (Screw) ×2 IMPLANT
SCREW LOCK VA ST 2.7X18 (Screw) ×1 IMPLANT
SCREW LOCKING 2.7X16MM VA (Screw) ×1 IMPLANT
SCREW METAPHYSCAL 18MM (Screw) ×1 IMPLANT
SCREW SHORT THREAD 4.0X40 (Screw) ×2 IMPLANT
SPLINT CAST 1 STEP 5X30 WHT (MISCELLANEOUS) ×2 IMPLANT
SPONGE LAP 18X18 RF (DISPOSABLE) ×2 IMPLANT
STAPLER SKIN PROX 35W (STAPLE) ×2 IMPLANT
SUT VIC AB 2-0 CT2 27 (SUTURE) ×2 IMPLANT
SUT VIC AB 3-0 SH 27 (SUTURE) ×2
SUT VIC AB 3-0 SH 27X BRD (SUTURE) ×1 IMPLANT
TAPE SURG TRANSPORE 1 IN (GAUZE/BANDAGES/DRESSINGS) ×1 IMPLANT
TAPE SURGICAL TRANSPORE 1 IN (GAUZE/BANDAGES/DRESSINGS) ×2
TOWEL OR 17X26 4PK STRL BLUE (TOWEL DISPOSABLE) ×4 IMPLANT

## 2020-03-11 NOTE — Anesthesia Procedure Notes (Signed)
Procedure Name: Intubation Date/Time: 03/11/2020 12:38 PM Performed by: Henrietta Hoover, CRNA Pre-anesthesia Checklist: Patient identified, Emergency Drugs available, Suction available and Patient being monitored Patient Re-evaluated:Patient Re-evaluated prior to induction Oxygen Delivery Method: Circle system utilized Preoxygenation: Pre-oxygenation with 100% oxygen Induction Type: IV induction Ventilation: Mask ventilation without difficulty Laryngoscope Size: 3 and McGraph Grade View: Grade I Tube type: Oral Tube size: 6.5 mm Number of attempts: 1 Airway Equipment and Method: Stylet and Video-laryngoscopy (Electively used videoscope) Placement Confirmation: ETT inserted through vocal cords under direct vision,  positive ETCO2 and breath sounds checked- equal and bilateral Secured at: 20 cm Tube secured with: Tape Dental Injury: Teeth and Oropharynx as per pre-operative assessment

## 2020-03-11 NOTE — H&P (Signed)
The patient has been re-examined, and the chart reviewed, and there have been no interval changes to the documented history and physical.  Plan a right ankle fracture repair today.  Anesthesia is consulted regarding a peripheral nerve block for post-operative pain.  The risks, benefits, and alternatives have been discussed at length, and the patient is willing to proceed.

## 2020-03-11 NOTE — Anesthesia Preprocedure Evaluation (Signed)
Anesthesia Evaluation  Patient identified by MRN, date of birth, ID band Patient awake    Reviewed: Allergy & Precautions, H&P , NPO status , Patient's Chart, lab work & pertinent test results  History of Anesthesia Complications Negative for: history of anesthetic complications  Airway Mallampati: II  TM Distance: >3 FB Neck ROM: full    Dental  (+) Chipped   Pulmonary neg shortness of breath, former smoker,    Pulmonary exam normal        Cardiovascular Exercise Tolerance: Good (-) angina(-) Past MI and (-) DOE negative cardio ROS Normal cardiovascular exam     Neuro/Psych  Headaches, negative psych ROS   GI/Hepatic negative GI ROS, Neg liver ROS, neg GERD  ,  Endo/Other  negative endocrine ROS  Renal/GU      Musculoskeletal   Abdominal   Peds  Hematology negative hematology ROS (+)   Anesthesia Other Findings Past Medical History: No date: Family history of brain cancer No date: Family history of breast cancer No date: Family history of colon cancer No date: Family history of Lynch syndrome No date: Family history of skin cancer No date: Family history of stomach cancer No date: Family history of uterine cancer No date: Headache(784.0) No date: Herpes  Past Surgical History: No date: NO PAST SURGERIES  BMI    Body Mass Index: 28.34 kg/m      Reproductive/Obstetrics negative OB ROS                             Anesthesia Physical Anesthesia Plan  ASA: II  Anesthesia Plan: General ETT   Post-op Pain Management: GA combined w/ Regional for post-op pain   Induction: Intravenous  PONV Risk Score and Plan: Ondansetron, Dexamethasone, Midazolam and Treatment may vary due to age or medical condition  Airway Management Planned: Oral ETT  Additional Equipment:   Intra-op Plan:   Post-operative Plan: Extubation in OR  Informed Consent: I have reviewed the patients  History and Physical, chart, labs and discussed the procedure including the risks, benefits and alternatives for the proposed anesthesia with the patient or authorized representative who has indicated his/her understanding and acceptance.     Dental Advisory Given  Plan Discussed with: Anesthesiologist, CRNA and Surgeon  Anesthesia Plan Comments: (Patient consented for risks of anesthesia including but not limited to:  - adverse reactions to medications - damage to eyes, teeth, lips or other oral mucosa - nerve damage due to positioning  - sore throat or hoarseness - Damage to heart, brain, nerves, lungs, other parts of body or loss of life  Patient voiced understanding.)        Anesthesia Quick Evaluation

## 2020-03-11 NOTE — Discharge Instructions (Addendum)
Patient already has follow-up appointment Call [phone] with questions Patient has pain medications at home  PER MD ORDERS - NON WEIGHT BEARING TO OPERATIVE EXTREMITY - USING YOUR CRUTCHES KEEP DRESSING CLEAN AND DRY ELEVATE OPERATIVE EXTREMITY  AMBULATORY SURGERY  DISCHARGE INSTRUCTIONS   1) The drugs that you were given will stay in your system until tomorrow so for the next 24 hours you should not:  A) Drive an automobile B) Make any legal decisions C) Drink any alcoholic beverage   2) You may resume regular meals tomorrow.  Today it is better to start with liquids and gradually work up to solid foods.  You may eat anything you prefer, but it is better to start with liquids, then soup and crackers, and gradually work up to solid foods.   3) Please notify your doctor immediately if you have any unusual bleeding, trouble breathing, redness and pain at the surgery site, drainage, fever, or pain not relieved by medication.    4) Additional Instructions:    Please contact your physician with any problems or Same Day Surgery at [phone], Monday through Friday 6 am to 4 pm, or [place] at [facility] number at [phone].

## 2020-03-11 NOTE — OR Nursing (Signed)
PT ADVISES SHE ONLY HAS "ABOUT 8" PAIN PILLS AT HOME AND REQUESTS MD TO ORDER MORE - SECURE CHAT SENT TO DR BOWERS REGARDING SAME.

## 2020-03-11 NOTE — Anesthesia Procedure Notes (Signed)
Anesthesia Regional Block: Popliteal block   Pre-Anesthetic Checklist: ,, timeout performed, Correct Patient, Correct Site, Correct Laterality, Correct Procedure, Correct Position, site marked, Risks and benefits discussed,  Surgical consent,  Pre-op evaluation,  At surgeon's request and post-op pain management  Laterality: Lower and Right  Prep: chloraprep       Needles:  Injection technique: Single-shot  Needle Type: Echogenic Needle     Needle Length: 9cm  Needle Gauge: 21     Additional Needles:   Procedures:,,,, ultrasound used (permanent image in chart),,,,  Narrative:  Start time: 03/11/2020 3:00 PM End time: 03/11/2020 3:07 PM Injection made incrementally with aspirations every 5 mL.  Performed by: Personally  Anesthesiologist: Trenyce Loera, Cleda Mccreedy, MD  Additional Notes: Right sided adductor PNB placed as well  Patient consented for risk and benefits of nerve block including but not limited to nerve damage, failed block, bleeding and infection.  Patient voiced understanding.  Functioning IV was confirmed and monitors were applied.  Timeout done prior to procedure and prior to any sedation being given to the patient.  Patient confirmed procedure site prior to any sedation given to the patient.  A 12mm 22ga Stimuplex needle was used. Sterile prep,hand hygiene and sterile gloves were used.  Minimal sedation used for procedure.  No paresthesia endorsed by patient during the procedure.  Negative aspiration and negative test dose prior to incremental administration of local anesthetic. The patient tolerated the procedure well with no immediate complications.

## 2020-03-11 NOTE — Transfer of Care (Signed)
Immediate Anesthesia Transfer of Care Note  Patient: Norma Fuentes  Procedure(s) Performed: OPEN REDUCTION INTERNAL FIXATION (ORIF) ANKLE FRACTURE (Right Ankle)  Patient Location: PACU  Anesthesia Type:GA combined with regional for post-op pain  Level of Consciousness: awake, drowsy and patient cooperative  Airway & Oxygen Therapy: Patient Spontanous Breathing and Patient connected to nasal cannula oxygen  Post-op Assessment: Report given to RN and Post -op Vital signs reviewed and stable  Post vital signs: Reviewed and stable  Last Vitals:  Vitals Value Taken Time  BP 110/71 03/11/20 1437  Temp 36.3 C 03/11/20 1432  Pulse 76 03/11/20 1440  Resp 14 03/11/20 1440  SpO2 97 % 03/11/20 1440  Vitals shown include unvalidated device data.  Last Pain:  Vitals:   03/11/20 1053  TempSrc: Tympanic  PainSc: 0-No pain         Complications: No complications documented.

## 2020-03-11 NOTE — Op Note (Signed)
  03/11/2020  2:49 PM  PATIENT:  Norma Fuentes  35 y.o. female  PRE-OPERATIVE DIAGNOSIS:  S82.61XD Disp fx of lateral malleolus of r fibula, 7thD  POST-OPERATIVE DIAGNOSIS:  Same  PROCEDURE:  OPEN REDUCTION INTERNAL FIXATION (ORIF) ANKLE FRACTURE, RIGHT, BIMALLEOLAR  SURGEON:  Cassell Smiles, MD  ASST:  Cam Hai, PA-C  ANESTHESIA:   General and Block  EBL:  50 mL  TOURNIQUET TIME:  52 min  OPERATIVE FINDINGS:  Displaced, unstable bimalleolar ankle fracture, right  OPERATIVE PROCEDURE:   The patient was brought to the operating room and placed in the supine position. All bony prominences were padded. General anesthesia was administered. The lower extremity was prepped and draped in the usual sterile fashion. The leg was elevated and exsanguinated and the tourniquet was inflated. Time out was performed.   Incision was made over the distal fibula and the fracture was exposed and reduced anatomically with a clamp. A lag screw was placed. I then applied a synthes lateral locking plate and secured it proximally with cortical screws and distally with locking screws.  I used the Fluoroscan to confirm satisfactory reduction and fixation. This wound was irrigated and closed with vicryl sutures and staples.  I then turned my attention to the medial malleolus. Incision was made over the medial malleolus and the fracture exposed and held provisionally with a clamp. 2 guidepins were placed for the 4.0 mm cannulated screws and then confirmation of reduction was made with fluoroscopy. I then placed 2  43mm screws which had satisfactory fixation. Fluoroscopy showed good reduction and hardware placement.   The syndesmosis was stressed using live fluoroscopy and found to be stable.   The medial wound was irrigated, and closed with vicryl and staples. Sponge and needle counts were correct. Sterile gauze was applied followed by a sugar tong splint. She was awakened and returned to the PACU in stable and  satisfactory condition. There were no apparent complications.  Cassell Smiles, MD

## 2020-03-12 ENCOUNTER — Encounter: Payer: Self-pay | Admitting: Orthopedic Surgery

## 2020-03-12 NOTE — Anesthesia Postprocedure Evaluation (Signed)
Anesthesia Post Note  Patient: Norma Fuentes  Procedure(s) Performed: OPEN REDUCTION INTERNAL FIXATION (ORIF) ANKLE FRACTURE (Right Ankle)  Patient location during evaluation: PACU Anesthesia Type: General Level of consciousness: awake and alert Pain management: pain level controlled Vital Signs Assessment: post-procedure vital signs reviewed and stable Respiratory status: spontaneous breathing, nonlabored ventilation, respiratory function stable and patient connected to nasal cannula oxygen Cardiovascular status: blood pressure returned to baseline and stable Postop Assessment: no apparent nausea or vomiting Anesthetic complications: no   No complications documented.   Last Vitals:  Vitals:   03/11/20 1542 03/11/20 1601  BP: 113/62 117/77  Pulse: 74 72  Resp: 13 16  Temp: 36.4 C 36.4 C  SpO2: 95% 97%    Last Pain:  Vitals:   03/11/20 1601  TempSrc: Temporal  PainSc: 0-No pain                 Cleda Mccreedy Jaleisa Brose

## 2020-06-03 DIAGNOSIS — S8261XD Displaced fracture of lateral malleolus of right fibula, subsequent encounter for closed fracture with routine healing: Secondary | ICD-10-CM | POA: Diagnosis not present

## 2020-06-13 DIAGNOSIS — H04123 Dry eye syndrome of bilateral lacrimal glands: Secondary | ICD-10-CM | POA: Diagnosis not present

## 2020-06-13 DIAGNOSIS — H44113 Panuveitis, bilateral: Secondary | ICD-10-CM | POA: Diagnosis not present

## 2020-06-13 DIAGNOSIS — Z1509 Genetic susceptibility to other malignant neoplasm: Secondary | ICD-10-CM | POA: Diagnosis not present

## 2020-06-13 DIAGNOSIS — H20823 Vogt-Koyanagi syndrome, bilateral: Secondary | ICD-10-CM | POA: Diagnosis not present

## 2020-06-13 DIAGNOSIS — Z79899 Other long term (current) drug therapy: Secondary | ICD-10-CM | POA: Diagnosis not present

## 2020-10-17 DIAGNOSIS — H20823 Vogt-Koyanagi syndrome, bilateral: Secondary | ICD-10-CM | POA: Diagnosis not present

## 2020-10-17 DIAGNOSIS — Z79899 Other long term (current) drug therapy: Secondary | ICD-10-CM | POA: Diagnosis not present

## 2020-10-17 DIAGNOSIS — H44113 Panuveitis, bilateral: Secondary | ICD-10-CM | POA: Diagnosis not present

## 2021-01-15 ENCOUNTER — Other Ambulatory Visit: Payer: Self-pay | Admitting: Nurse Practitioner

## 2021-01-15 DIAGNOSIS — R079 Chest pain, unspecified: Secondary | ICD-10-CM

## 2021-03-20 DIAGNOSIS — H44113 Panuveitis, bilateral: Secondary | ICD-10-CM | POA: Diagnosis not present

## 2021-03-20 DIAGNOSIS — Z1509 Genetic susceptibility to other malignant neoplasm: Secondary | ICD-10-CM | POA: Diagnosis not present

## 2021-03-20 DIAGNOSIS — H04123 Dry eye syndrome of bilateral lacrimal glands: Secondary | ICD-10-CM | POA: Diagnosis not present

## 2021-03-20 DIAGNOSIS — H43823 Vitreomacular adhesion, bilateral: Secondary | ICD-10-CM | POA: Diagnosis not present

## 2021-03-20 DIAGNOSIS — H20823 Vogt-Koyanagi syndrome, bilateral: Secondary | ICD-10-CM | POA: Diagnosis not present

## 2021-06-26 DIAGNOSIS — H44113 Panuveitis, bilateral: Secondary | ICD-10-CM | POA: Diagnosis not present

## 2021-06-26 DIAGNOSIS — H20823 Vogt-Koyanagi syndrome, bilateral: Secondary | ICD-10-CM | POA: Diagnosis not present

## 2021-06-26 DIAGNOSIS — H04123 Dry eye syndrome of bilateral lacrimal glands: Secondary | ICD-10-CM | POA: Diagnosis not present

## 2021-06-26 DIAGNOSIS — H43823 Vitreomacular adhesion, bilateral: Secondary | ICD-10-CM | POA: Diagnosis not present

## 2021-08-06 ENCOUNTER — Ambulatory Visit
Admission: EM | Admit: 2021-08-06 | Discharge: 2021-08-06 | Disposition: A | Discharge status: Home / Self Care | Payer: BC Managed Care – PPO | Attending: Internal Medicine | Admitting: Internal Medicine

## 2021-08-06 DIAGNOSIS — G43011 Migraine without aura, intractable, with status migrainosus: Secondary | ICD-10-CM

## 2021-08-06 MED ORDER — SUMATRIPTAN SUCCINATE 6 MG/0.5ML ~~LOC~~ SOLN: 6.0000 mg | Freq: Once | SUBCUTANEOUS | Status: DC

## 2021-08-06 MED ORDER — DEXAMETHASONE SODIUM PHOSPHATE 10 MG/ML IJ SOLN: 10.0000 mg | Freq: Once | INTRAMUSCULAR | Status: DC

## 2021-08-06 MED ORDER — METOCLOPRAMIDE HCL 5 MG/ML IJ SOLN: 5.0000 mg | Freq: Once | INTRAMUSCULAR | Status: DC

## 2021-08-06 MED ORDER — ONDANSETRON 4 MG PO TBDP: 4.0000 mg | ORAL_TABLET | Freq: Three times a day (TID) | ORAL | 0 refills | Status: AC | PRN

## 2021-08-06 NOTE — ED Provider Notes (Signed)
[facility]    CSN: [id number] Arrival date & time: 08/06/21  1035      History   Chief Complaint Chief Complaint  Patient presents with   Migraine    HPI Norma Fuentes is a 36 y.o. female comes to the urgent care with a 2-day history of left-sided retro-orbital headache, nausea vomiting of 2 days duration.  Headache is aggravated by light and noise.  No known relieving factors.  Patient endorses generalized body aches.  No radiation of pain.  She has tried Imitrex with no improvement in symptoms.  Patient endorses sick contacts.  Her child has a viral upper respiratory infection symptoms.  Patient denies any sore throat or diarrhea.  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   Genetic testing 04/19/2019   Family history of Lynch syndrome    Family history of uterine cancer    Family history of colon cancer    Family history of stomach cancer    Family history of brain cancer    Family history of breast cancer    Family history of skin cancer    History of herpes genitalis 10/01/2011   Cystic acne, extensive 04/23/2011    Past Surgical History:  Procedure Laterality Date   NO PAST SURGERIES     ORIF ANKLE FRACTURE Right 03/11/2020   Procedure: OPEN REDUCTION INTERNAL FIXATION (ORIF) ANKLE FRACTURE;  Surgeon: Lovell Sheehan, MD;  Location: [facility] ORS;  Service: Orthopedics;  Laterality: Right;    OB History     Gravida  2   Para  1   Term  1   Preterm  0   AB  1   Living  1      SAB  1   IAB  0   Ectopic   0   Multiple  0   Live Births  1            Home Medications    Prior to Admission medications   Medication Sig Start Date End Date Taking? Authorizing Provider  ondansetron (ZOFRAN-ODT) 4 MG disintegrating tablet Take 1 tablet (4 mg total) by mouth every 8 (eight) hours as needed for nausea or vomiting. 08/06/21  Yes Aydien Majette, Myrene Galas, MD  Ascorbic Acid (VITAMIN C) 1000 MG tablet Take 1,000 mg by mouth daily.    [provider]  Calcium-Magnesium-Zinc (CAL-MAG-ZINC PO) Take 1 tablet by mouth daily.    [provider]  Cholecalciferol (VITAMIN D3) 125 MCG (5000 UT) CAPS Take 5,000 Units by mouth daily.    [provider]  HYDROcodone-acetaminophen (NORCO/VICODIN) 5-325 MG tablet Take 1 tablet by mouth every 6 (six) hours as needed for moderate pain.    [provider]  ibuprofen (ADVIL) 200 MG tablet Take 400 mg by mouth every 8 (eight) hours as needed for moderate pain.    [provider]  SUMAtriptan (IMITREX) 50 MG tablet Take 50 mg by mouth every 2 (two)  hours as needed for migraine. May repeat in 2 hours if headache persists or recurs.    [provider]    Family History Family History  Problem Relation Age of Onset   Endometriosis Maternal Aunt    Endometrial cancer Maternal Aunt 30   Diabetes Paternal Grandmother    Skin cancer Paternal Grandmother        dx. 40s   Endometrial cancer Mother 27       Lynch syndrome, MSH6 positive   Basal cell carcinoma Brother    Stomach cancer Maternal Grandfather 79   Colon cancer Maternal Grandfather 3   Brain cancer Maternal Aunt 42       Cancerous hypothalamus   Colon cancer Cousin 12       Lynch syndrome   Breast cancer Cousin 53   Breast cancer Cousin 68       not genetic    Social History Social History   Tobacco Use   Smoking status: Former    Packs/day: 0.50    Years: 15.00    Total pack years: 7.50    Types: Cigarettes    Quit date: 10/04/2018    Years  since quitting: 2.8   Smokeless tobacco: Never  Vaping Use   Vaping Use: Every day   Substances: Nicotine  Substance Use Topics   Alcohol use: No   Drug use: No     Allergies   Patient has no known allergies.   Review of Systems Review of Systems  Constitutional: Negative.   HENT: Negative.    Cardiovascular: Negative.   Gastrointestinal: Negative.   Genitourinary: Negative.   Musculoskeletal:  Positive for myalgias.  Neurological:  Positive for headaches. Negative for speech difficulty and weakness.     Physical Exam Triage Vital Signs ED Triage Vitals  Enc Vitals Group     BP 08/06/21 1051 109/74     Pulse Rate 08/06/21 1051 76     Resp 08/06/21 1051 18     Temp 08/06/21 1051 98.1 F (36.7 C)     Temp Source 08/06/21 1051 Oral     SpO2 08/06/21 1051 97 %     Weight --      Height --      Head Circumference --      Peak Flow --      Pain Score 08/06/21 1050 9     Pain Loc --      Pain Edu? --      Excl. in [place]? --    No data found.  Updated Vital Signs BP 109/74 (BP Location: Left Arm)   Pulse 76   Temp 98.1 F (36.7 C) (Oral)   Resp 18   LMP 07/28/2021   SpO2 97%   Visual Acuity Right Eye Distance:   Left Eye Distance:   Bilateral Distance:    Right Eye Near:   Left Eye Near:    Bilateral Near:     Physical Exam Vitals and nursing note reviewed.  Constitutional:      General: She is not in acute distress.    Appearance: She is not ill-appearing.  HENT:     Right Ear: Tympanic membrane normal.     Left Ear: Tympanic membrane normal.     Mouth/Throat:     Pharynx: No posterior oropharyngeal erythema.  Cardiovascular:     Rate and Rhythm: Normal rate and regular rhythm.     Pulses: Normal pulses.     Heart sounds: Normal heart sounds.  Pulmonary:     Effort: Pulmonary effort is normal.     Breath sounds: Normal breath sounds. No wheezing or rhonchi.  Abdominal:     General: Bowel sounds are normal.     Palpations: Abdomen is soft.   Musculoskeletal:        General: Normal range of motion.     Cervical back: Neck supple.  Neurological:     General: No focal deficit present.     Mental Status: She is alert and oriented to person, place, and time.     Cranial Nerves: No cranial nerve deficit.     Sensory: No sensory deficit.     Motor: No weakness.      UC Treatments / Results  Labs (all labs ordered are listed, but only abnormal results are displayed) Labs Reviewed  NOVEL CORONAVIRUS, NAA    EKG   Radiology No results found.  Procedures Procedures (including critical care time)  Medications Ordered in UC Medications  metoCLOPramide (REGLAN) injection 5 mg (5 mg Intramuscular Not Given 08/06/21 1126)  dexamethasone (DECADRON) injection 10 mg (10 mg Intramuscular Not Given 08/06/21 1126)  SUMAtriptan (IMITREX) injection 6 mg (6 mg Subcutaneous Not Given 08/06/21 1126)    Initial Impression / Assessment and Plan / UC Course  I have reviewed the triage vital signs and the nursing notes.  Pertinent labs & imaging results that were available during my care of the patient were reviewed by me and considered in my medical decision making (see chart for details).     1.  Intractable migraine: Failed Imitrex use. IM dexamethasone, Reglan and subcu Imitrex was recommended.  Patient refused use medications Zofran as needed for pain Increase oral fluid intake Return to urgent care if symptoms are persistent or worsens No neurologic deficits on physical exam.  Final Clinical Impressions(s) / UC Diagnoses   Final diagnoses:  Intractable migraine without aura and with status migrainosus     Discharge Instructions      This take medications as prescribed Increase oral fluid intake We will call you with recommendations if labs are abnormal Return to urgent care if symptoms worsen.   ED Prescriptions     Medication Sig Dispense Auth. Provider   ondansetron (ZOFRAN-ODT) 4 MG disintegrating tablet  Take 1 tablet (4 mg total) by mouth every 8 (eight) hours as needed for nausea or vomiting. 20 tablet Chazz Philson, Myrene Galas, MD      PDMP not reviewed this encounter.   Chase Picket, MD 08/06/21 1257

## 2021-08-06 NOTE — Discharge Instructions (Signed)
This take medications as prescribed Increase oral fluid intake We will call you with recommendations if labs are abnormal Return to urgent care if symptoms worsen.

## 2021-08-06 NOTE — ED Triage Notes (Signed)
Pt presents with ongoing migraine with sensitivity to light, nausea, and vomiting X 2 days that is unrelieved with prescribed medication.  Pt states she took imitrex before arriving

## 2021-08-07 LAB — NOVEL CORONAVIRUS, NAA: SARS-CoV-2, NAA: NOT DETECTED

## 2021-12-11 DIAGNOSIS — H04123 Dry eye syndrome of bilateral lacrimal glands: Secondary | ICD-10-CM | POA: Diagnosis not present

## 2021-12-11 DIAGNOSIS — H43823 Vitreomacular adhesion, bilateral: Secondary | ICD-10-CM | POA: Diagnosis not present

## 2021-12-11 DIAGNOSIS — H44113 Panuveitis, bilateral: Secondary | ICD-10-CM | POA: Diagnosis not present

## 2021-12-11 DIAGNOSIS — H20823 Vogt-Koyanagi syndrome, bilateral: Secondary | ICD-10-CM | POA: Diagnosis not present

## 2021-12-16 ENCOUNTER — Encounter: Payer: Self-pay | Admitting: Nurse Practitioner

## 2021-12-16 ENCOUNTER — Ambulatory Visit (INDEPENDENT_AMBULATORY_CARE_PROVIDER_SITE_OTHER): Payer: BC Managed Care – PPO | Admitting: Nurse Practitioner

## 2021-12-16 VITALS — BP 112/76 | HR 81 | Ht 61.0 in | Wt 147.0 lb

## 2021-12-16 DIAGNOSIS — Z7689 Persons encountering health services in other specified circumstances: Secondary | ICD-10-CM

## 2021-12-16 DIAGNOSIS — G43909 Migraine, unspecified, not intractable, without status migrainosus: Secondary | ICD-10-CM

## 2021-12-16 DIAGNOSIS — H20823 Vogt-Koyanagi syndrome, bilateral: Secondary | ICD-10-CM | POA: Diagnosis not present

## 2021-12-16 MED ORDER — SUMATRIPTAN SUCCINATE 50 MG PO TABS: ORAL_TABLET | ORAL | 5 refills | Status: AC

## 2021-12-16 NOTE — Progress Notes (Signed)
New Patient Office Visit  Subjective    Patient ID: Norma Fuentes, female    DOB: [birth year]  Age: 36 y.o. MRN: [id number]  CC:  Chief Complaint  Patient presents with   New Patient (Initial Visit)    HPI Norma Fuentes presents to establish care Patient has not had primary care in some time.  Has not had GYN provider.  She is due to have routine, fasting blood work.  She needs to have pap smear  Does have problems with migraine headaches -has 2 to 3 times per month. Happen more often around the time of her menstrual period.  -imitrex does work well. She does need refill for this today  -patient's job does offer intermittent FMLA which she would like to utilize as she does have to take time off during migraine headaches. States that she misses 2 to 3 days each month due to migraine headaches.  -denies chest pain, chest pressure, or shortness of breath. She denies headaches or visual disturbances. She denies abdominal pain, nausea, vomiting, or changes in bowel or bladder habits.     Outpatient Encounter Medications as of 12/16/2021  Medication Sig   Ascorbic Acid (VITAMIN C) 1000 MG tablet Take 1,000 mg by mouth daily.   Calcium-Magnesium-Zinc (CAL-MAG-ZINC PO) Take 1 tablet by mouth daily.   Cholecalciferol (VITAMIN D3) 125 MCG (5000 UT) CAPS Take 5,000 Units by mouth daily.   HYDROcodone-acetaminophen (NORCO/VICODIN) 5-325 MG tablet Take 1 tablet by mouth every 6 (six) hours as needed for moderate pain.   ibuprofen (ADVIL) 200 MG tablet Take 400 mg by mouth every 8 (eight) hours as needed for moderate pain.   ondansetron (ZOFRAN-ODT) 4 MG disintegrating tablet Take 1 tablet (4 mg total) by mouth every 8 (eight) hours as needed for nausea or vomiting.   [DISCONTINUED] SUMAtriptan (IMITREX) 50 MG tablet Take 50 mg by mouth every 2 (two) hours as needed for migraine. May repeat in 2 hours if headache persists or recurs.   SUMAtriptan (IMITREX) 50 MG tablet Take 1 tablet by  mouth one time. May repeat in 2 hours if headache persists or recurs.   No facility-administered encounter medications on file as of 12/16/2021.    Past Medical History:  Diagnosis Date   Family history of brain cancer    Family history of breast cancer    Family history of colon cancer    Family history of Lynch syndrome    Family history of skin cancer    Family history of stomach cancer    Family history of uterine cancer    Headache(784.0)    Herpes     Past Surgical History:  Procedure Laterality Date   NO PAST SURGERIES     ORIF ANKLE FRACTURE Right 03/11/2020   Procedure: OPEN REDUCTION INTERNAL FIXATION (ORIF) ANKLE FRACTURE;  Surgeon: Lovell Sheehan, MD;  Location: [facility] ORS;  Service: Orthopedics;  Laterality: Right;    Family History  Problem Relation Age of Onset   Endometriosis Maternal Aunt    Endometrial cancer Maternal Aunt 30   Diabetes Paternal Grandmother    Skin cancer Paternal Grandmother        dx. 72s   Endometrial cancer Mother 45       Lynch syndrome, MSH6 positive   Basal cell carcinoma Brother    Stomach cancer Maternal Grandfather 55   Colon cancer Maternal Grandfather 52   Brain cancer Maternal Aunt 42       Cancerous hypothalamus  Colon cancer Cousin 73       Lynch syndrome   Breast cancer Cousin 37   Breast cancer Cousin 76       not genetic    Social History   Socioeconomic History   Marital status: Married    Spouse name: Not on file   Number of children: Not on file   Years of education: Not on file   Highest education level: Not on file  Occupational History   Not on file  Tobacco Use   Smoking status: Former    Packs/day: 0.50    Years: 15.00    Total pack years: 7.50    Types: Cigarettes    Quit date: 10/04/2018    Years since quitting: 3.2   Smokeless tobacco: Never  Vaping Use   Vaping Use: Every day   Substances: Nicotine  Substance and Sexual Activity   Alcohol use: No   Drug use: No   Sexual activity: Yes     Birth control/protection: Pill  Other Topics Concern   Not on file  Social History Narrative   Not on file   Social Determinants of Health   Financial Resource Strain: Not on file  Food Insecurity: Not on file  Transportation Needs: Not on file  Physical Activity: Not on file  Stress: Not on file  Social Connections: Not on file  Intimate Partner Violence: Not on file    Review of Systems  Constitutional:  Negative for chills, fever and malaise/fatigue.  HENT:  Negative for congestion, sinus pain and sore throat.   Eyes: Negative.   Respiratory:  Negative for cough, shortness of breath and wheezing.   Cardiovascular:  Negative for chest pain, palpitations and leg swelling.  Gastrointestinal:  Negative for constipation, diarrhea, nausea and vomiting.  Genitourinary: Negative.   Musculoskeletal:  Negative for myalgias.  Skin: Negative.   Neurological:  Positive for headaches. Negative for dizziness.       Story of migraine headaches   Endo/Heme/Allergies:  Does not bruise/bleed easily.  Psychiatric/Behavioral:  Negative for depression. The patient is not nervous/anxious.         Objective    Today's Vitals   12/16/21 1035  BP: 112/76  Pulse: 81  SpO2: 99%  Weight: 147 lb (66.7 kg)  Height: $Remove'5\' 1"'lwEnoTI$  (1.549 m)   Body mass index is 27.78 kg/m.   Physical Exam Vitals and nursing note reviewed.  Constitutional:      Appearance: Normal appearance. She is well-developed.  HENT:     Head: Normocephalic and atraumatic.  Eyes:     Pupils: Pupils are equal, round, and reactive to light.  Cardiovascular:     Rate and Rhythm: Normal rate and regular rhythm.     Pulses: Normal pulses.     Heart sounds: Normal heart sounds.  Pulmonary:     Effort: Pulmonary effort is normal.     Breath sounds: Normal breath sounds.  Abdominal:     Palpations: Abdomen is soft.  Musculoskeletal:        General: Normal range of motion.     Cervical back: Normal range of motion and  neck supple.  Lymphadenopathy:     Cervical: No cervical adenopathy.  Skin:    General: Skin is warm and dry.     Capillary Refill: Capillary refill takes less than 2 seconds.  Neurological:     General: No focal deficit present.     Mental Status: She is alert and oriented to person, place, and  time.  Psychiatric:        Mood and Affect: Mood normal.        Behavior: Behavior normal.        Thought Content: Thought content normal.        Judgment: Judgment normal.      Assessment & Plan:  1. Acute migraine May continue to take imitrex as needed and as prescribed for acute migraine headache. Will complete intermittent FMLA paperwork for her due to these migraines. She does miss 2 to 3 days of work during most months due to severity of headaches.  - SUMAtriptan (IMITREX) 50 MG tablet; Take 1 tablet by mouth one time. May repeat in 2 hours if headache persists or recurs.  Dispense: 10 tablet; Refill: 5  2. Vogt-Koyanagi syndrome of both eyes She is followed by [facility].   3. Encounter to establish care Appointment today to establish new primary care provider      Problem List Items Addressed This Visit       Cardiovascular and Mediastinum   Acute migraine - Primary   Relevant Medications   SUMAtriptan (IMITREX) 50 MG tablet     Musculoskeletal and Integument   Vogt-Koyanagi syndrome of both eyes   Other Visit Diagnoses     Encounter to establish care           Return in about 2 months (around 02/15/2022) for health maintenance exam, with pap, FBW a week prior to visit.   Ronnell Freshwater, NP

## 2022-01-14 ENCOUNTER — Telehealth: Payer: Self-pay

## 2022-01-14 NOTE — Telephone Encounter (Signed)
Ok PCP is advised

## 2022-01-14 NOTE — Telephone Encounter (Signed)
Pt just came by to drop off FMLA paperwork.  Copies have been placed in Katrina box.

## 2022-02-10 ENCOUNTER — Encounter: Payer: Self-pay | Admitting: Nurse Practitioner

## 2022-02-17 ENCOUNTER — Ambulatory Visit: Payer: BC Managed Care – PPO | Admitting: Nurse Practitioner

## 2022-02-18 ENCOUNTER — Other Ambulatory Visit: Payer: Self-pay | Admitting: Nurse Practitioner

## 2022-02-18 DIAGNOSIS — Z0000 Encounter for general adult medical examination without abnormal findings: Secondary | ICD-10-CM

## 2022-03-03 ENCOUNTER — Other Ambulatory Visit: Payer: BC Managed Care – PPO

## 2022-03-10 ENCOUNTER — Encounter: Payer: BC Managed Care – PPO | Admitting: Nurse Practitioner

## 2022-04-03 NOTE — Telephone Encounter (Signed)
I don't see a copy of this in patient chart. Was this done or does pt still need forms completed ?

## 2022-04-06 NOTE — Telephone Encounter (Signed)
Per Nira Conn she is working on the paperwork 04/06/2022

## 2022-06-18 ENCOUNTER — Ambulatory Visit
Admission: EM | Admit: 2022-06-18 | Discharge: 2022-06-18 | Disposition: A | Discharge status: Home / Self Care | Payer: BC Managed Care – PPO | Attending: Internal Medicine | Admitting: Internal Medicine

## 2022-06-18 ENCOUNTER — Other Ambulatory Visit: Payer: Self-pay

## 2022-06-18 DIAGNOSIS — R102 Pelvic and perineal pain: Secondary | ICD-10-CM

## 2022-06-18 DIAGNOSIS — R3989 Other symptoms and signs involving the genitourinary system: Secondary | ICD-10-CM

## 2022-06-18 DIAGNOSIS — Z3202 Encounter for pregnancy test, result negative: Secondary | ICD-10-CM

## 2022-06-18 DIAGNOSIS — Z113 Encounter for screening for infections with a predominantly sexual mode of transmission: Secondary | ICD-10-CM | POA: Diagnosis not present

## 2022-06-18 LAB — POCT URINALYSIS DIP (MANUAL ENTRY)
Bilirubin, UA: NEGATIVE
Glucose, UA: NEGATIVE mg/dL
Ketones, POC UA: NEGATIVE mg/dL
Nitrite, UA: NEGATIVE
Protein Ur, POC: NEGATIVE mg/dL
Spec Grav, UA: 1.015 (ref 1.010–1.025)
Urobilinogen, UA: 0.2 E.U./dL
pH, UA: 7 (ref 5.0–8.0)

## 2022-06-18 LAB — POCT URINE PREGNANCY: Preg Test, Ur: NEGATIVE

## 2022-06-18 MED ORDER — NITROFURANTOIN MONOHYD MACRO 100 MG PO CAPS: 100.0000 mg | ORAL_CAPSULE | Freq: Two times a day (BID) | ORAL | 0 refills | Status: AC

## 2022-06-18 NOTE — ED Provider Notes (Signed)
[facility]    CSN: [id number] Arrival date & time: 06/18/22  0831      History   Chief Complaint Chief Complaint  Patient presents with   Dysuria    HPI Norma Fuentes is a 37 y.o. female.   Patient presents with urinary discomfort.  Patient reports that it feels like there is a lot of pressure when she urinates especially at the end of the urine stream.  She does have a little bit of discomfort at the end of urination.  She denies urinary frequency, hematuria, vaginal discharge, back pain, fever, nausea, vomiting, chills.  Patient denies any exposure to STD but has had unprotected sexual intercourse.  Last menstrual cycle was approximately 2 weeks ago.   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   Genetic testing 04/19/2019   Family history of Lynch syndrome    Family history of uterine cancer    Family history of colon cancer    Family history of stomach cancer    Family history of brain cancer    Family history of breast cancer    Family history of skin cancer    History of herpes genitalis 10/01/2011   Cystic acne, extensive 04/23/2011    Past Surgical History:  Procedure Laterality Date   NO PAST SURGERIES     ORIF ANKLE FRACTURE Right 03/11/2020   Procedure: OPEN REDUCTION INTERNAL FIXATION (ORIF) ANKLE FRACTURE;  Surgeon: Lyndle Herrlich, MD;  Location: [facility] ORS;  Service: Orthopedics;  Laterality: Right;    OB History     Gravida  2   Para  1   Term  1   Preterm  0   AB   1   Living  1      SAB  1   IAB  0   Ectopic  0   Multiple  0   Live Births  1            Home Medications    Prior to Admission medications   Medication Sig Start Date End Date Taking? Authorizing Provider  nitrofurantoin, macrocrystal-monohydrate, (MACROBID) 100 MG capsule Take 1 capsule (100 mg total) by mouth 2 (two) times daily. 06/18/22  Yes Antwione Picotte, Rolly Salter E, FNP  Ascorbic Acid (VITAMIN C) 1000 MG tablet Take 1,000 mg by mouth daily.    [provider]  Calcium-Magnesium-Zinc (CAL-MAG-ZINC PO) Take 1 tablet by mouth daily.    [provider]  Cholecalciferol (VITAMIN D3) 125 MCG (5000 UT) CAPS Take 5,000 Units by mouth daily.    [provider]  HYDROcodone-acetaminophen (NORCO/VICODIN) 5-325 MG tablet Take 1 tablet by mouth every 6 (six) hours as needed for moderate pain.    [provider]  ibuprofen (ADVIL) 200 MG tablet Take 400 mg by mouth every 8 (eight) hours as needed for moderate pain.    [provider]  ondansetron (ZOFRAN-ODT) 4 MG disintegrating tablet Take 1  tablet (4 mg total) by mouth every 8 (eight) hours as needed for nausea or vomiting. 08/06/21   Lamptey, Britta Mccreedy, MD  SUMAtriptan (IMITREX) 50 MG tablet Take 1 tablet by mouth one time. May repeat in 2 hours if headache persists or recurs. 12/16/21   Carlean Jews, NP    Family History Family History  Problem Relation Age of Onset   Endometriosis Maternal Aunt    Endometrial cancer Maternal Aunt 30   Diabetes Paternal Grandmother    Skin cancer Paternal Grandmother        dx. 40s   Endometrial cancer Mother 53       Lynch syndrome, MSH6 positive   Basal cell carcinoma Brother    Stomach cancer Maternal Grandfather 34   Colon cancer Maternal Grandfather 62   Brain cancer Maternal Aunt 42       Cancerous hypothalamus   Colon cancer Cousin 64       Lynch syndrome   Breast cancer Cousin 79   Breast cancer Cousin 42       not genetic    Social  History Social History   Tobacco Use   Smoking status: Former    Packs/day: 0.50    Years: 15.00    Additional pack years: 0.00    Total pack years: 7.50    Types: Cigarettes    Quit date: 10/04/2018    Years since quitting: 3.7   Smokeless tobacco: Never  Vaping Use   Vaping Use: Every day   Substances: Nicotine  Substance Use Topics   Alcohol use: No   Drug use: No     Allergies   Patient has no known allergies.   Review of Systems Review of Systems Per HPI  Physical Exam Triage Vital Signs ED Triage Vitals  Enc Vitals Group     BP 06/18/22 0904 108/72     Pulse Rate 06/18/22 0903 85     Resp 06/18/22 0903 16     Temp 06/18/22 0903 98.5 F (36.9 C)     Temp Source 06/18/22 0903 Oral     SpO2 06/18/22 0903 98 %     Weight --      Height --      Head Circumference --      Peak Flow --      Pain Score 06/18/22 0902 5     Pain Loc --      Pain Edu? --      Excl. in GC? --    No data found.  Updated Vital Signs BP 108/72   Pulse 85   Temp 98.5 F (36.9 C) (Oral)   Resp 16   LMP 06/02/2022   SpO2 98%   Visual Acuity Right Eye Distance:   Left Eye Distance:   Bilateral Distance:    Right Eye Near:   Left Eye Near:    Bilateral Near:     Physical Exam Constitutional:      General: She is not in acute distress.    Appearance: Normal appearance. She is not toxic-appearing or diaphoretic.  HENT:     Head: Normocephalic and atraumatic.  Eyes:     Extraocular Movements: Extraocular movements intact.     Conjunctiva/sclera: Conjunctivae normal.  Pulmonary:     Effort: Pulmonary effort is normal.  Genitourinary:    Comments: Deferred with shared decision making.  Self swab performed. Neurological:     General: No focal deficit present.     Mental Status: She is  alert and oriented to person, place, and time. Mental status is at baseline.  Psychiatric:        Mood and Affect: Mood normal.        Behavior: Behavior normal.        Thought  Content: Thought content normal.        Judgment: Judgment normal.      UC Treatments / Results  Labs (all labs ordered are listed, but only abnormal results are displayed) Labs Reviewed  POCT URINALYSIS DIP (MANUAL ENTRY) - Abnormal; Notable for the following components:      Result Value   Clarity, UA hazy (*)    Blood, UA trace-intact (*)    Leukocytes, UA Trace (*)    All other components within normal limits  URINE CULTURE  POCT URINE PREGNANCY  CERVICOVAGINAL ANCILLARY ONLY    EKG   Radiology No results found.  Procedures Procedures (including critical care time)  Medications Ordered in UC Medications - No data to display  Initial Impression / Assessment and Plan / UC Course  I have reviewed the triage vital signs and the nursing notes.  Pertinent labs & imaging results that were available during my care of the patient were reviewed by me and considered in my medical decision making (see chart for details).     Patient has trace leukocytes on UA and with associated symptoms, this is concerning for UTI.  Therefore, will opt to treat with Macrobid.  Although, will send cervicovaginal swab as well to ensure that vaginitis is not etiology of symptoms.  Awaiting urine culture as well.  Will change treatment if necessary once results are complete.  Advised to ensure that she is drinking plenty of water as well.  Discussed return precautions.  Patient verbalized understanding and was agreeable with plan. Final Clinical Impressions(s) / UC Diagnoses   Final diagnoses:  Sensation of pressure in bladder area  Suprapubic pain  Urine pregnancy test negative  Screening examination for venereal disease     Discharge Instructions      I am treating you for urinary tract with Macrobid antibiotic.  Urine culture and vaginal swab are pending.  Will call if they are abnormal and treat as appropriate.  Please refrain from sexual activity until test results and treatment are  complete.    ED Prescriptions     Medication Sig Dispense Auth. Provider   nitrofurantoin, macrocrystal-monohydrate, (MACROBID) 100 MG capsule Take 1 capsule (100 mg total) by mouth 2 (two) times daily. 10 capsule Gustavus Bryant, [place]      PDMP not reviewed this encounter.   Gustavus Bryant, [place] 06/18/22 1000

## 2022-06-18 NOTE — ED Triage Notes (Signed)
Pt presents to [facility] with co of pelvic pain after urination since sat. No otc meds.

## 2022-06-18 NOTE — Discharge Instructions (Signed)
I am treating you for urinary tract with Macrobid antibiotic.  Urine culture and vaginal swab are pending.  Will call if they are abnormal and treat as appropriate.  Please refrain from sexual activity until test results and treatment are complete.

## 2022-06-19 LAB — CERVICOVAGINAL ANCILLARY ONLY
Bacterial Vaginitis (gardnerella): NEGATIVE
Candida Glabrata: NEGATIVE
Candida Vaginitis: NEGATIVE
Chlamydia: NEGATIVE
Comment: NEGATIVE
Comment: NEGATIVE
Comment: NEGATIVE
Comment: NEGATIVE
Comment: NEGATIVE
Comment: NORMAL
Neisseria Gonorrhea: NEGATIVE
Trichomonas: NEGATIVE

## 2022-06-21 LAB — URINE CULTURE: Culture: 80000 — AB

## 2022-10-29 DIAGNOSIS — H20823 Vogt-Koyanagi syndrome, bilateral: Secondary | ICD-10-CM | POA: Diagnosis not present

## 2022-10-29 DIAGNOSIS — H04123 Dry eye syndrome of bilateral lacrimal glands: Secondary | ICD-10-CM | POA: Diagnosis not present

## 2022-10-29 DIAGNOSIS — H43823 Vitreomacular adhesion, bilateral: Secondary | ICD-10-CM | POA: Diagnosis not present

## 2022-10-29 DIAGNOSIS — H44113 Panuveitis, bilateral: Secondary | ICD-10-CM | POA: Diagnosis not present
